# Patient Record
Sex: Male | Born: 1945 | Hispanic: No | Marital: Married | Smoking: Former smoker
Health system: Southern US, Community
[De-identification: ages and names within clinical notes are randomized; demographics above are authoritative.]

## PROBLEM LIST (undated history)

## (undated) DIAGNOSIS — E785 Hyperlipidemia, unspecified: Secondary | ICD-10-CM

## (undated) DIAGNOSIS — E119 Type 2 diabetes mellitus without complications: Secondary | ICD-10-CM

## (undated) DIAGNOSIS — I10 Essential (primary) hypertension: Secondary | ICD-10-CM

## (undated) DIAGNOSIS — J90 Pleural effusion, not elsewhere classified: Secondary | ICD-10-CM

## (undated) HISTORY — DX: Essential (primary) hypertension: I10

## (undated) HISTORY — PX: WISDOM TOOTH EXTRACTION: SHX21

## (undated) HISTORY — DX: Hyperlipidemia, unspecified: E78.5

---

## 2007-10-25 ENCOUNTER — Telehealth (INDEPENDENT_AMBULATORY_CARE_PROVIDER_SITE_OTHER): Payer: Self-pay | Admitting: *Deleted

## 2007-10-25 ENCOUNTER — Encounter (INDEPENDENT_AMBULATORY_CARE_PROVIDER_SITE_OTHER): Payer: Self-pay | Admitting: *Deleted

## 2009-02-18 ENCOUNTER — Encounter (INDEPENDENT_AMBULATORY_CARE_PROVIDER_SITE_OTHER): Payer: Self-pay | Admitting: *Deleted

## 2009-03-01 ENCOUNTER — Ambulatory Visit: Payer: Self-pay | Admitting: Internal Medicine

## 2009-03-01 ENCOUNTER — Telehealth (INDEPENDENT_AMBULATORY_CARE_PROVIDER_SITE_OTHER): Payer: Self-pay | Admitting: *Deleted

## 2009-03-01 LAB — CONVERTED CEMR LAB
AST: 33 units/L (ref 0–37)
Albumin: 4 g/dL (ref 3.5–5.2)
Alkaline Phosphatase: 75 units/L (ref 39–117)
BUN: 19 mg/dL (ref 6–23)
Bilirubin Urine: NEGATIVE
Bilirubin, Direct: 0.1 mg/dL (ref 0.0–0.3)
Cholesterol: 344 mg/dL — ABNORMAL HIGH (ref 0–200)
Creatinine, Ser: 0.8 mg/dL (ref 0.4–1.5)
GFR calc non Af Amer: 104.32 mL/min (ref >60)
Glucose, Bld: 127 mg/dL — ABNORMAL HIGH (ref 70–99)
HCT: 42.3 % (ref 39.0–52.0)
Hemoglobin: 13.6 g/dL (ref 13.0–17.0)
Ketones, urine, test strip: NEGATIVE
MCV: 88.9 fL (ref 78.0–100.0)
Nitrite: NEGATIVE
Nitrite: NEGATIVE
Platelets: 314 10*3/uL (ref 150.0–400.0)
Potassium: 4 meq/L (ref 3.5–5.1)
Protein, U semiquant: NEGATIVE
RDW: 12.6 % (ref 11.5–14.6)
Specific Gravity, Urine: <1.005
Total Bilirubin: 0.5 mg/dL (ref 0.3–1.2)
Total CHOL/HDL Ratio: 5
Urobilinogen, UA: 0.2
Urobilinogen, UA: NEGATIVE
VLDL: 194.4 mg/dL — ABNORMAL HIGH (ref 0.0–40.0)
WBC Urine, dipstick: NEGATIVE
WBC Urine, dipstick: NEGATIVE

## 2009-03-05 ENCOUNTER — Ambulatory Visit: Payer: Self-pay | Admitting: Internal Medicine

## 2009-03-05 DIAGNOSIS — M109 Gout, unspecified: Secondary | ICD-10-CM | POA: Insufficient documentation

## 2009-03-05 DIAGNOSIS — E785 Hyperlipidemia, unspecified: Secondary | ICD-10-CM

## 2009-03-05 DIAGNOSIS — I10 Essential (primary) hypertension: Secondary | ICD-10-CM | POA: Insufficient documentation

## 2009-03-11 LAB — CONVERTED CEMR LAB: Hgb A1c MFr Bld: 7.4 % — ABNORMAL HIGH (ref 4.6–6.5)

## 2009-04-12 ENCOUNTER — Telehealth (INDEPENDENT_AMBULATORY_CARE_PROVIDER_SITE_OTHER): Payer: Self-pay | Admitting: *Deleted

## 2009-06-11 ENCOUNTER — Ambulatory Visit: Payer: Self-pay | Admitting: Internal Medicine

## 2009-06-12 LAB — CONVERTED CEMR LAB
ALT: 27 units/L (ref 0–53)
AST: 21 units/L (ref 0–37)
Albumin: 4.3 g/dL (ref 3.5–5.2)
Alkaline Phosphatase: 59 units/L (ref 39–117)
Direct LDL: 158.7 mg/dL
HDL: 44.7 mg/dL (ref >39.00)
Hgb A1c MFr Bld: 7 % — ABNORMAL HIGH (ref 4.6–6.5)
Total Bilirubin: 0.5 mg/dL (ref 0.3–1.2)
VLDL: 130 mg/dL — ABNORMAL HIGH (ref 0.0–40.0)

## 2009-06-18 ENCOUNTER — Ambulatory Visit: Payer: Self-pay | Admitting: Internal Medicine

## 2009-09-13 ENCOUNTER — Ambulatory Visit: Payer: Self-pay | Admitting: Internal Medicine

## 2009-09-16 LAB — CONVERTED CEMR LAB
Creatinine, Ser: 0.8 mg/dL (ref 0.4–1.5)
Creatinine,U: 70.3 mg/dL
Microalb, Ur: 0.5 mg/dL (ref 0.0–1.9)
Total CHOL/HDL Ratio: 7
VLDL: 183 mg/dL — ABNORMAL HIGH (ref 0.0–40.0)

## 2009-09-20 ENCOUNTER — Encounter (INDEPENDENT_AMBULATORY_CARE_PROVIDER_SITE_OTHER): Payer: Self-pay | Admitting: *Deleted

## 2009-09-20 ENCOUNTER — Ambulatory Visit: Payer: Self-pay | Admitting: Internal Medicine

## 2009-09-20 DIAGNOSIS — E119 Type 2 diabetes mellitus without complications: Secondary | ICD-10-CM | POA: Insufficient documentation

## 2009-09-20 DIAGNOSIS — M2550 Pain in unspecified joint: Secondary | ICD-10-CM | POA: Insufficient documentation

## 2009-10-09 ENCOUNTER — Ambulatory Visit: Payer: Self-pay | Admitting: Internal Medicine

## 2009-12-23 ENCOUNTER — Ambulatory Visit: Payer: Self-pay | Admitting: Internal Medicine

## 2009-12-23 LAB — CONVERTED CEMR LAB
Creatinine, Ser: 0.9 mg/dL (ref 0.4–1.5)
Hgb A1c MFr Bld: 6.5 % (ref 4.6–6.5)
Total CHOL/HDL Ratio: 7
Uric Acid, Serum: 7 mg/dL (ref 4.0–7.8)
VLDL: 88.2 mg/dL — ABNORMAL HIGH (ref 0.0–40.0)

## 2009-12-26 ENCOUNTER — Ambulatory Visit: Payer: Self-pay | Admitting: Internal Medicine

## 2010-02-04 NOTE — Assessment & Plan Note (Signed)
Summary: 3 MO. F/U - JR/rescd cbs   Vital Signs:  Patient profile:   65 year old male Weight:      196.2 pounds Pulse rate:   84 / minute Resp:     16 per minute BP sitting:   124 / 80  (left arm) Cuff size:   large  Vitals Entered By: Shonna Chock (June 18, 2009 8:33 AM) CC: 3 Month follow-up on labs , Hypertension Management Comments REVIEWED MED LIST, PATIENT AGREED DOSE AND INSTRUCTION CORRECT    CC:  3 Month follow-up on labs  and Hypertension Management.  History of Present Illness: Labs  & risks reviewed & discussed.  All parameters improved but still not @ goal.Active @ work but no regular CVE. Decreased white carbs. Weight down 5# since 02/2009.FBS not checked. No hypoglycemia.  Hypertension History:      He denies headache, chest pain, palpitations, dyspnea with exertion, orthopnea, PND, peripheral edema, visual symptoms, neurologic problems, syncope, and side effects from treatment.  BP 130/80.        Positive major cardiovascular risk factors include male age 62 years old or older, diabetes, hyperlipidemia, and hypertension.  Negative major cardiovascular risk factors include non-tobacco-user status.     Allergies (verified): No Known Drug Allergies  Review of Systems General:  Denies fatigue. Eyes:  Denies blurring, double vision, and vision loss-both eyes; No retinopathy 3 mos ago. CV:  Denies leg cramps with exertion, lightheadness, and near fainting. Derm:  Denies poor wound healing. Neuro:  Denies numbness and tingling; No burning in feet. Endo:  Denies excessive hunger, excessive thirst, and excessive urination.  Physical Exam  General:  well-nourished; alert,appropriate and cooperative throughout examination Lungs:  Normal respiratory effort, chest expands symmetrically. Lungs are clear to auscultation, no crackles or wheezes. Heart:  Normal rate and regular rhythm. S1 and S2 normal without gallop, murmur, click, rub or other extra sounds. Abdomen:  Bowel  sounds positive,abdomen soft and non-tender without masses, organomegaly or hernias noted. Pulses:  R and L carotid,radial,dorsalis pedis and posterior tibial pulses are full and equal bilaterally Extremities:  No clubbing, cyanosis, edema. Neurologic:  alert & oriented X3 and sensation intact to light touch over feet.   Skin:  Intact without suspicious lesions or rashes Psych:  memory intact for recent and remote, normally interactive, and good eye contact.     Impression & Recommendations:  Problem # 1:  DIABETES MELLITUS, UNCONTROLLED (ICD-250.02) But A1c  improved His updated medication list for this problem includes:    Lisinopril 40 Mg Tabs (Lisinopril) .Marland Kitchen... 1 once daily    Metformin Hcl 500 Mg Xr24h-tab (Metformin hcl) .Marland Kitchen... 1 two times a day with 2 largest meals  Problem # 2:  HYPERTENSION (ICD-401.9) Controlled His updated medication list for this problem includes:    Lisinopril 40 Mg Tabs (Lisinopril) .Marland Kitchen... 1 once daily    Amlodipine Besylate 5 Mg Tabs (Amlodipine besylate) .Marland Kitchen... 1 once daily  Problem # 3:  HYPERLIPIDEMIA (ICD-272.4) TG significantly lower but not @ goal The following medications were removed from the medication list:    Antara 130 Mg Caps (Fenofibrate micronized) .Marland Kitchen... 1 once daily  Problem # 4:  GOUT (ICD-274.9) Quiescent The following medications were removed from the medication list:    Allopurinol 100 Mg Tabs (Allopurinol) .Marland Kitchen... Take 1 tab by mouth once daily  Complete Medication List: 1)  Indomethacin 50 Mg Caps (Indomethacin) .... Take 1 cap three times a day with food x3days,then two times a day for  3 days, then 1 once daily 2)  Cialis 10 Mg Tabs (Tadalafil) .... Take 1 tab by mouth once daily 1 hour before needed 3)  Lisinopril 40 Mg Tabs (Lisinopril) .Marland Kitchen.. 1 once daily 4)  Amlodipine Besylate 5 Mg Tabs (Amlodipine besylate) .Marland Kitchen.. 1 once daily 5)  Metformin Hcl 500 Mg Xr24h-tab (Metformin hcl) .Marland Kitchen.. 1 two times a day with 2 largest  meals  Hypertension Assessment/Plan:      The patient's hypertensive risk group is category C: Target organ damage and/or diabetes.  Today's blood pressure is 124/80.    Patient Instructions: 1)  Consume LESS THAN 40 grams of HFCS "sugar" / day. 2)  It is important that you exercise regularly at least 20 minutes 5 times a week. If you develop chest pain, have severe difficulty breathing, or feel very tired , stop exercising immediately and seek medical attention. 3)  Check your blood sugars regularly. If your readings are usually above : 150 or below 90 you should contact our office. 4)  Check your feet each night for sore areas, calluses or signs of infection. 5)  Take an  81 mg coated Aspirin every day. 6)  Please schedule a follow-up appointment in 3 months. 7)  BUN,creat, K+ prior to visit, ICD-9:401.9 8)  Lipid Panel prior to visit, ICD-9:272.2; uric acid :274.9 9)  HbgA1C prior to visit, ICD-9:250.02 10)  Urine Microalbumin prior to visit, ICD-9:250.02 Prescriptions: METFORMIN HCL 500 MG XR24H-TAB (METFORMIN HCL) 1 two times a day WITH 2 largest meals  #180 x 0   Entered and Authorized by:   Marga Melnick MD   Signed by:   Marga Melnick MD on 06/18/2009   Method used:   Faxed to ...       [facility] [address] (retail)       [phone]       [place], [place]  [address]       Ph: [phone]       Fax: [phone]   RxID:   [id number]

## 2010-02-04 NOTE — Progress Notes (Signed)
Summary: NEEDS LABS FAXED  Phone Note Call from Patient Call back at Work Phone [phone] Call back at FAX - [phone]   Caller: Patient Summary of Call: PATIENT WILL SEE DR HOPPER FOR NEW PATIENT VISIT ON 3/1, BUT HE HAD LABS TODAY ON 2/25---HE WOULD LIKE HIS LABS FAXED TO HIS OFFICE AT [phone]   Initial call taken by: Jerolyn Shin,  March 01, 2009 12:19 PM  Follow-up for Phone Call        done.Marland KitchenMarland KitchenDoristine Devoid  March 04, 2009 8:14 AM

## 2010-02-04 NOTE — Assessment & Plan Note (Signed)
Summary: NEW TO EST,CPX,BCBS,LABS PRIOR/RH......   Vital Signs:  Patient profile:   65 year old male Height:      68 inches Weight:      196 pounds BMI:     29.91 Temp:     98.6 degrees F oral Pulse rate:   92 / minute Resp:     18 per minute BP sitting:   170 / 104  (left arm)  Vitals Entered By: Jeremy Johann CMA (March 05, 2009 1:08 PM) CC: NEW TO ESTABLISH, elevated BP, gout, Hypertension Management   CC:  NEW TO ESTABLISH, elevated BP, gout, and Hypertension Management.  History of Present Illness: Derrick Singleton is establishing as a new patient; he had seen [facility] for dyslipidemia & HTN.  Hypertension History:      He denies headache, chest pain, palpitations, dyspnea with exertion, orthopnea, PND, peripheral edema, visual symptoms, neurologic problems, and syncope.  Further comments include: BP 139/100- 194/133 off meds X 3 weeks.        Positive major cardiovascular risk factors include male age 98 years old or older, hyperlipidemia, and hypertension.  Negative major cardiovascular risk factors include non-tobacco-user status.     Preventive Screening-Counseling & Management  Alcohol-Tobacco     Smoking Status: quit  Caffeine-Diet-Exercise     Does Patient Exercise: yes  Allergies (verified): No Known Drug Allergies  Past History:  Past Medical History: Gout Hyperlipidemia Hypertension Low Testosterone  Past Surgical History: Wisdom Teeth Resection; Colonoscopy negative @ age 76  Family History: Father: lung cancer(smoker) Mother: neg Siblings: neg  Social History: Occupation:Owner of [employer] Married Former Smoker: smoked 40 yrs; quit 2008 Alcohol use-yes: wine 1-2 /night Regular exercise-yes Diet : "60% [place] , 40% American"Smoking Status:  quit Does Patient Exercise:  yes  Review of Systems  The patient denies anorexia, fever, vision loss, decreased hearing, hoarseness, prolonged cough, hemoptysis, abdominal pain, melena,  hematochezia, severe indigestion/heartburn, hematuria, incontinence, suspicious skin lesions, depression, unusual weight change, abnormal bleeding, enlarged lymph nodes, and angioedema.         Weight gain 15 #.  MS:  Complains of joint pain; denies joint redness, joint swelling, low back pain, mid back pain, and thoracic pain; PMH of R ankle & L hand gout attacks; treated with Indomethacin until swelling gone then Allopurinol X 1-2 months.  Physical Exam  General:  well-nourished; alert,appropriate and cooperative throughout examination Head:  Normocephalic and atraumatic without obvious abnormalities. No  alopecia Eyes:  No corneal or conjunctival inflammation noted. EOMI. Perrla. Funduscopic exam benign, without hemorrhages, exudates or papilledema. Vision grossly normal. Ears:  External ear exam shows no significant lesions or deformities.  Otoscopic examination reveals clear canals, tympanic membranes are intact bilaterally without bulging, retraction, inflammation or discharge. Hearing is grossly normal bilaterally. mild scarring R TM Nose:  External nasal examination shows no deformity or inflammation. Nasal mucosa are pink and moist without lesions or exudates. Mouth:  Oral mucosa and oropharynx without lesions or exudates.  Dental staining Neck:  No deformities, masses, or tenderness noted. Lungs:  Normal respiratory effort, chest expands symmetrically. Lungs are clear to auscultation, no crackles or wheezes. Heart:  Normal rate and regular rhythm. S1 and S2 normal without  murmur, click, rub. S4 gallop;grade1  /6 systolic murmur.   Abdomen:  Bowel sounds positive,abdomen soft and non-tender without masses, organomegaly. Ventral  hernia noted. Rectal:  No external abnormalities noted. Normal sphincter tone. No rectal masses or tenderness. Genitalia:  Testes bilaterally descended without nodularity, tenderness or  masses. No scrotal masses or lesions. No penis lesions or urethral discharge.  L varicocele.   Prostate:  Prostate gland firm and smooth, ULN  enlargement, w/o nodularity, tenderness, mass, asymmetry or induration. Msk:  No deformity or scoliosis noted of thoracic or lumbar spine.   Pulses:  R and L carotid,radial,dorsalis pedis and posterior tibial pulses are full and equal bilaterally Extremities:  No clubbing, cyanosis, edema, or deformity noted with normal full range of motion of all joints.   Neurologic:  alert & oriented X3, gait normal, and DTRs symmetrical and normal.   Skin:  Intact without suspicious lesions or rashes Cervical Nodes:  No lymphadenopathy noted Axillary Nodes:  No palpable lymphadenopathy Inguinal Nodes:  No significant adenopathy Psych:  memory intact for recent and remote, normally interactive, and good eye contact.     Impression & Recommendations:  Problem # 1:  PREVENTIVE HEALTH CARE (ICD-V70.0)  Orders: EKG w/ Interpretation (93000)  Problem # 2:  HYPERTENSION (ICD-401.9)  uncontrolled off meds   Orders: EKG w/ Interpretation (93000)  His updated medication list for this problem includes:    Lisinopril 40 Mg Tabs (Lisinopril) .Marland Kitchen... 1 once daily    Amlodipine Besylate 5 Mg Tabs (Amlodipine besylate) .Marland Kitchen... 1 once daily  Problem # 3:  HYPERLIPIDEMIA (ICD-272.4)  His updated medication list for this problem includes:    Antara 130 Mg Caps (Fenofibrate micronized) .Marland Kitchen... 1 once daily  Problem # 4:  GOUT (ICD-274.9)  His updated medication list for this problem includes:    Allopurinol 100 Mg Tabs (Allopurinol) .Marland Kitchen... Take 1 tab by mouth once daily  Complete Medication List: 1)  Indomethacin 50 Mg Caps (Indomethacin) .... Take 1 cap three times a day with food x3days,then two times a day for 3 days, then 1 once daily 2)  Allopurinol 100 Mg Tabs (Allopurinol) .... Take 1 tab by mouth once daily 3)  Cialis 10 Mg Tabs (Tadalafil) .... Take 1 tab by mouth once daily 1 hour before needed 4)  Lisinopril 40 Mg Tabs (Lisinopril) .Marland Kitchen..  1 once daily 5)  Amlodipine Besylate 5 Mg Tabs (Amlodipine besylate) .Marland Kitchen.. 1 once daily 6)  Antara 130 Mg Caps (Fenofibrate micronized) .Marland Kitchen.. 1 once daily  Hypertension Assessment/Plan:      The patient's hypertensive risk group is category B: At least one risk factor (excluding diabetes) with no target organ damage.  Today's blood pressure is 170/104.    Patient Instructions: 1)  Consume complex brown carbs in preference to white  carbs & LESS THAN 40 grams of sugar / day from foods & drinks with High Fructose Corn Syrup as #1 ,2 or #3 on label. 2)  Please schedule a follow-up appointment in 3 months. 3)  Hepatic Panel ;uric acid; 4)  Lipid Panel; 5)  HbgA1C . 6)  Check your Blood Pressure regularly. Your  minimum goal = < 140/90 Prescriptions: CIALIS 10 MG TABS (TADALAFIL) TAKE 1 TAB by mouth once daily 1 HOUR BEFORE NEEDED  #6 x 5   Entered and Authorized by:   Marga Melnick MD   Signed by:   Marga Melnick MD on 03/05/2009   Method used:   Print then Give to Patient   RxID:   [id number] INDOMETHACIN 50 MG CAPS (INDOMETHACIN) TAKE 1 CAP three times a day WITH FOOD X3DAYS,THEN two times a day FOR 3 DAYS, THEN 1 once daily  #30 x 1   Entered and Authorized by:   Marga Melnick MD   Signed by:  Marga Melnick MD on 03/05/2009   Method used:   Print then Give to Patient   RxID:   [id number] ANTARA 130 MG CAPS (FENOFIBRATE MICRONIZED) 1 once daily  #30 x 5   Entered and Authorized by:   Marga Melnick MD   Signed by:   Marga Melnick MD on 03/05/2009   Method used:   Print then Give to Patient   RxID:   [id number] AMLODIPINE BESYLATE 5 MG TABS (AMLODIPINE BESYLATE) 1 once daily  #30 x 5   Entered and Authorized by:   Marga Melnick MD   Signed by:   Marga Melnick MD on 03/05/2009   Method used:   Print then Give to Patient   RxID:   [id number] LISINOPRIL 40 MG TABS (LISINOPRIL) 1 once daily  #30 x 5   Entered and Authorized by:   Marga Melnick MD   Signed  by:   Marga Melnick MD on 03/05/2009   Method used:   Print then Give to Patient   RxID:   [id number]

## 2010-02-04 NOTE — Letter (Signed)
Summary: New Patient Letter  [facility]  [address] [place], [place] [address]   Phone: [phone]  Fax: [phone]       02/18/2009 MRN: [id number]  [facility] [address] [place], [place]  [address]  Dear Derrick Singleton,   Welcome to [facility] and thank you for choosing [place] as your Primary Care Providers. Enclosed you will find information about our practice that we hope you find helpful. We have also enclosed forms to be filled out prior to your visit. This will provide [place] with the necessary information and facilitate your being seen in a timely manner. If you have any questions, please call us at: [phone] and we will be happy to assist you. We look forward to seeing you at your scheduled appointment time.  Appointment Monday, March 18, 2009 at 10:45am  with Dr. Marga Melnick  Sincerely,  Primary Health Care Team  Please arrive 15 minutes early for your first appointment and bring your insurance card. Co-pay is required at the time of your visit.  *****Please call the office if you are not able to keep this appointment. There is a charge of $50.00 if any appointment is not cancelled or rescheduled within 24 hours*****

## 2010-02-04 NOTE — Assessment & Plan Note (Signed)
Summary: RTO 3 MONTHS/CBS   Vital Signs:  Patient profile:   65 year old male Weight:      187 pounds Temp:     98.1 degrees F oral Pulse rate:   84 / minute Resp:     15 per minute BP sitting:   118 / 80  (left arm) Cuff size:   large  Vitals Entered By: Shonna Chock CMA (September 20, 2009 9:38 AM) CC: Follow-up visit: Discuss labs (patient with mailed copy), Type 2 diabetes mellitus follow-up   CC:  Follow-up visit: Discuss labs (patient with mailed copy) and Type 2 diabetes mellitus follow-up.  History of Present Illness: Hypertension Follow-Up      This is a 65 year old man who presents for Hypertension follow-up.  The patient denies lightheadedness, urinary frequency, headaches, and fatigue.  The patient denies the following associated symptoms: chest pain, chest pressure, exercise intolerance, dyspnea, palpitations, syncope, leg edema, and pedal edema.  Compliance with medications (by patient report) has been near 100%.  The patient reports that dietary compliance has been excellent.  The patient reports exercising occasionally.  Adjunctive measures currently used by the patient include modified  salt restriction.  BP records reviewed : average 120/82. Type 2 Diabetes Mellitus Follow-Up      The patient is also here for Type 2 diabetes mellitus follow-up.  The patient reports weight loss, but denies polyuria, polydipsia, blurred vision, self managed hypoglycemia, weight gain, and numbness of extremities.  The patient denies the following symptoms: neuropathic pain, vomiting, orthostatic symptoms, poor wound healing, intermittent claudication, vision loss, and foot ulcer.  Since the last visit the patient reports compliance with medications.  The patient has been measuring capillary blood glucose before breakfast; average 127, high 135.  Since the last visit, the patient reports having had no eye care and no foot care. Hyperlipidemia Follow-Up      The patient also presents for  Hyperlipidemia follow-up.  The patient denies muscle aches, GI upset, abdominal pain, flushing, itching, constipation, and diarrhea.  Lipids reviewed : TG 915. He is avoiding HFCS.  Current Medications (verified): 1)  Indomethacin 50 Mg Caps (Indomethacin) .... Take 1 Cap Three Times A Day With Food X3days,then Two Times A Day For 3 Days, Then 1 Once Daily 2)  Cialis 10 Mg Tabs (Tadalafil) .... Take 1 Tab By Mouth Once Daily 1 Hour Before Needed 3)  Lisinopril 40 Mg Tabs (Lisinopril) .Marland Kitchen.. 1 Once Daily 4)  Amlodipine Besylate 5 Mg Tabs (Amlodipine Besylate) .Marland Kitchen.. 1 Once Daily 5)  Metformin Hcl 500 Mg Xr24h-Tab (Metformin Hcl) .Marland Kitchen.. 1 Two Times A Day With 2 Largest Meals  Allergies (verified): No Known Drug Allergies  Review of Systems MS:  Complains of joint pain and joint swelling; denies joint redness; L wrist swollen intermittently , better with Indomethacin.  Physical Exam  General:  well-nourished; alert,appropriate and cooperative throughout examination Lungs:  Normal respiratory effort, chest expands symmetrically. Lungs are clear to auscultation, no crackles or wheezes. Heart:  normal rate, regular rhythm, no gallop, no rub, no JVD, no HJR, and grade 1 /6 systolic murmur.   Pulses:  R and L carotid,radial,dorsalis pedis and posterior tibial pulses are full and equal bilaterally Extremities:  No clubbing, cyanosis, edema. Fusiform changes L wrist Skin:  Intact without suspicious lesions or rashes Cervical Nodes:  No lymphadenopathy noted Axillary Nodes:  No palpable lymphadenopathy Psych:  memory intact for recent and remote, normally interactive, and good eye contact.   Focused & intelligent  Impression & Recommendations:  Problem # 1:  DIABETES MELLITUS, CONTROLLED (ICD-250.00)  His updated medication list for this problem includes:    Lisinopril 40 Mg Tabs (Lisinopril) .Marland Kitchen... 1 once daily    Metformin Hcl 500 Mg Xr24h-tab (Metformin hcl) .Marland Kitchen... 1 two times a day with 2 largest  meals  Orders: Specimen Handling ([address])  Problem # 2:  ARTHRALGIA (ICD-719.40)  R/O gout  Orders: Venipuncture (60454) TLB-Uric Acid, Blood (84550-URIC) T-Wrist Comp Left Min 3 Views (73110TC) Specimen Handling (09811)  Problem # 3:  HYPERLIPIDEMIA (ICD-272.4)  His updated medication list for this problem includes:    Trilipix 135 Mg Cpdr (Choline fenofibrate) .Marland Kitchen... 1 once daily  Problem # 4:  HYPERTENSION (ICD-401.9) controlled His updated medication list for this problem includes:    Lisinopril 40 Mg Tabs (Lisinopril) .Marland Kitchen... 1 once daily    Amlodipine Besylate 5 Mg Tabs (Amlodipine besylate) .Marland Kitchen... 1 once daily  Problem # 5:  SCREENING, COLON CANCER (ICD-V76.51)  Orders: Gastroenterology Referral (GI)  Complete Medication List: 1)  Indomethacin 50 Mg Caps (Indomethacin) .... Take 1 cap three times a day with food x3days,then two times a day for 3 days, then 1 once daily 2)  Cialis 10 Mg Tabs (Tadalafil) .... Take 1 tab by mouth once daily 1 hour before needed 3)  Lisinopril 40 Mg Tabs (Lisinopril) .Marland Kitchen.. 1 once daily 4)  Amlodipine Besylate 5 Mg Tabs (Amlodipine besylate) .Marland Kitchen.. 1 once daily 5)  Metformin Hcl 500 Mg Xr24h-tab (Metformin hcl) .Marland Kitchen.. 1 two times a day with 2 largest meals 6)  Trilipix 135 Mg Cpdr (Choline fenofibrate) .Marland Kitchen.. 1 once daily  Other Orders: Admin 1st Vaccine (91478) Flu Vaccine 63yrs + (29562) Flu Vaccine Consent Questions     Do you have a history of severe allergic reactions to this vaccine? no    Any prior history of allergic reactions to egg and/or gelatin? no    Do you have a sensitivity to the preservative Thimersol? no    Do you have a past history of Guillan-Barre Syndrome? no    Do you currently have an acute febrile illness? no    Have you ever had a severe reaction to latex? no    Vaccine information given and explained to patient? yes    Are you currently pregnant? no    Lot Number:[id number]   Exp Date:07/05/2010   Site Given  Right  Deltoid IMOrders: Admin 1st Vaccine (13086) Flu Vaccine 63yrs + (57846)  Patient Instructions: 1)  Please schedule a follow-up appointment in 3 months. 2)  Lipid Panel; Uric Acid;HbgA1C & BUN,creat, K+ prior to visit. Prescriptions: METFORMIN HCL 500 MG XR24H-TAB (METFORMIN HCL) 1 two times a day WITH 2 largest meals  #180 x 1   Entered and Authorized by:   Marga Melnick MD   Signed by:   Marga Melnick MD on 09/20/2009   Method used:   Print then Give to Patient   RxID:   [id number] AMLODIPINE BESYLATE 5 MG TABS (AMLODIPINE BESYLATE) 1 once daily  #90 x 1   Entered and Authorized by:   Marga Melnick MD   Signed by:   Marga Melnick MD on 09/20/2009   Method used:   Print then Give to Patient   RxID:   [id number] LISINOPRIL 40 MG TABS (LISINOPRIL) 1 once daily  #90 x 1   Entered and Authorized by:   Marga Melnick MD   Signed by:   Marga Melnick MD on 09/20/2009   Method used:  Print then Give to Patient   RxID:   [id number] CIALIS 10 MG TABS (TADALAFIL) TAKE 1 TAB by mouth once daily 1 HOUR BEFORE NEEDED  #6 x 5   Entered and Authorized by:   Marga Melnick MD   Signed by:   Marga Melnick MD on 09/20/2009   Method used:   Print then Give to Patient   RxID:   [id number] INDOMETHACIN 50 MG CAPS (INDOMETHACIN) TAKE 1 CAP three times a day WITH FOOD X3DAYS,THEN two times a day FOR 3 DAYS, THEN 1 once daily  #30 Each x 1   Entered and Authorized by:   Marga Melnick MD   Signed by:   Marga Melnick MD on 09/20/2009   Method used:   Print then Give to Patient   RxID:   [id number] TRILIPIX 135 MG CPDR (CHOLINE FENOFIBRATE) 1 once daily  #30 x 2   Entered and Authorized by:   Marga Melnick MD   Signed by:   Marga Melnick MD on 09/20/2009   Method used:   Print then Give to Patient   RxID:   [id number]  .lbflu

## 2010-02-04 NOTE — Progress Notes (Signed)
Summary: Wants rx changed to generic  Phone Note Call from Patient Call back at Home Phone [phone]   Caller: Patient Call For: Marga Melnick MD Summary of Call: Needs a return call Initial call taken by: Barnie Mort,  April 12, 2009 10:59 AM  Follow-up for Phone Call        wanted to know if some meds could be changed to generic; Antara 130mg , pt says med is costing #130 for 30 pills. if possible willneed 90 days sent to [facility] on [address]. Kandice Hams  April 12, 2009 11:42 AM  Follow-up by: Kandice Hams,  April 12, 2009 11:42 AM  Additional Follow-up for Phone Call Additional follow up Details #1::        see samples ; with card is supposed to cost no more than $15 /month Additional Follow-up by: Marga Melnick MD,  April 12, 2009 12:58 PM    Additional Follow-up for Phone Call Additional follow up Details #2::    spoke with pt informed of samples upfront with  savings card .Kandice Hams  April 12, 2009 1:04 PM  Follow-up by: Kandice Hams,  April 12, 2009 1:05 PM

## 2010-02-04 NOTE — Letter (Signed)
Summary: Pre Visit Letter Revised  [facility]  [address] [place], [place] [address]   Phone: [phone]  Fax: [phone]        09/20/2009 MRN: [id number]   [facility] [address] [place], [place]  [address]             Welcome to the Gastroenterology Division at [facility].    You are scheduled to see a nurse for your pre-procedure visit on October 15, 2009 at 2:00pm on the 3rd floor at [facility], [address]  We ask that you try to arrive at our office 15 minutes prior to your appointment time to allow for check-in.  Please take a minute to review the attached form.  If you answer "Yes" to one or more of the questions on the first page, we ask that you call the person listed at your earliest opportunity.  If you answer "No" to all of the questions, please complete the rest of the form and bring it to your appointment.    Your nurse visit will consist of discussing your medical and surgical history, your immediate family medical history, and your medications.   If you are unable to list all of your medications on the form, please bring the medication bottles to your appointment and we will list them.  We will need to be aware of both prescribed and over the counter drugs.  We will need to know exact dosage information as well.    Please be prepared to read and sign documents such as consent forms, a financial agreement, and acknowledgement forms.  If necessary, and with your consent, a friend or relative is welcome to sit-in on the nurse visit with you.  Please bring your insurance card so that we may make a copy of it.  If your insurance requires a referral to see a specialist, please bring your referral form from your primary care physician.  No co-pay is required for this nurse visit.     If you cannot keep your appointment, please call [phone] to cancel or reschedule prior to your appointment date.  This allows [place] the  opportunity to schedule an appointment for another patient in need of care.    Thank you for choosing [facility] for your medical needs.  We appreciate the opportunity to care for you.  Please visit [place] at our website  to learn more about our practice.  Sincerely, The Gastroenterology Division

## 2010-02-06 NOTE — Assessment & Plan Note (Signed)
Summary: rto 3 months/cbs   Vital Signs:  Patient profile:   65 year old male Weight:      191.4 pounds BMI:     29.21 Pulse rate:   72 / minute Resp:     14 per minute BP sitting:   118 / 70  (left arm) Cuff size:   large  Vitals Entered By: Shonna Chock CMA (December 26, 2009 10:29 AM) CC: 3 month follow-up ( copy of labs given) , Type 2 diabetes mellitus follow-up   CC:  3 month follow-up ( copy of labs given)  and Type 2 diabetes mellitus follow-up.  History of Present Illness: Hypertension Follow-Up      This is a 65 year old man who presents for Hypertension follow-up.  The patient denies lightheadedness, urinary frequency, headaches, edema, and fatigue.  The patient denies the following associated symptoms: chest pain, chest pressure, dyspnea, palpitations, and syncope.  Compliance with medications (by patient report) has been near 100%.  The patient reports no exercise.  Adjunctive measures currently used by the patient include salt restriction.  BP 13/90 @ home. Hyperlipidemia Follow-Up      The patient also presents for Hyperlipidemia follow-up.  The patient denies muscle aches, GI upset, abdominal pain, flushing, constipation, and diarrhea.  Compliance with medications (by patient report) has been near 100%.  Adjunctive measures currently used by the patient include fiber and fish oil supplements.  TG decreased from 915 to 441. LDL 160.9. Risks discussed. Type 2 Diabetes Mellitus Follow-Up      The patient is also here for Type 2 diabetes mellitus follow-up.  The patient reports polydipsia, but denies blurred vision, self managed hypoglycemia, and numbness of extremities.  The patient denies the following symptoms: neuropathic pain, orthostatic symptoms, poor wound healing, vision loss, and foot ulcer.  The patient has been measuring capillary blood glucose before breakfast, FBS 107-140.  Since the last visit, the patient reports having had no eye care and no foot care. A1c 6.5 %  ( average sugar 140, risk 30%).    Current Medications (verified): 1)  Cialis 10 Mg Tabs (Tadalafil) .... Take 1 Tab By Mouth Once Daily 1 Hour Before Needed 2)  Lisinopril 40 Mg Tabs (Lisinopril) .Marland Kitchen.. 1 Once Daily 3)  Amlodipine Besylate 5 Mg Tabs (Amlodipine Besylate) .Marland Kitchen.. 1 Once Daily 4)  Metformin Hcl 500 Mg Xr24h-Tab (Metformin Hcl) .Marland Kitchen.. 1 Two Times A Day With 2 Largest Meals 5)  Trilipix 135 Mg Cpdr (Choline Fenofibrate) .Marland Kitchen.. 1 Once Daily  Allergies (verified): No Known Drug Allergies  Physical Exam  General:  well-nourished;alert,appropriate and cooperative throughout examination Lungs:  Normal respiratory effort, chest expands symmetrically. Lungs are clear to auscultation, no crackles or wheezes. Heart:  Normal rate and regular rhythm. S1 and S2 normal without gallop, murmur, click, rub .S 4 Pulses:  R and L carotid,radial,dorsalis pedis and posterior tibial pulses are full and equal bilaterally Extremities:  No clubbing, cyanosis, edema, or deformity noted .Good nail health. Neurologic:  alert & oriented X3 and sensation intact to light touch over feet.   Skin:  Intact without suspicious lesions or rashes; minor granulomatous change RUE  Psych:  memory intact for recent and remote, normally interactive, and good eye contact.     Impression & Recommendations:  Problem # 1:  HYPERLIPIDEMIA (ICD-272.4) dramatic improvement but TG not @ goal His updated medication list for this problem includes:    Trilipix 135 Mg Cpdr (Choline fenofibrate) .Marland Kitchen... 1 once daily  Livalo 2 Mg Tabs (Pitavastatin calcium) .Marland Kitchen... 1 every weds  Problem # 2:  HYPERTENSION (ICD-401.9) controlled His updated medication list for this problem includes:    Lisinopril 40 Mg Tabs (Lisinopril) .Marland Kitchen... 1 once daily    Amlodipine Besylate 5 Mg Tabs (Amlodipine besylate) .Marland Kitchen... 1 once daily  Problem # 3:  DIABETES MELLITUS, CONTROLLED (ICD-250.00) A1c 6.5 5 His updated medication list for this problem  includes:    Lisinopril 40 Mg Tabs (Lisinopril) .Marland Kitchen... 1 once daily    Metformin Hcl 500 Mg Xr24h-tab (Metformin hcl) .Marland Kitchen... 1 two times a day with 2 largest meals  Complete Medication List: 1)  Cialis 10 Mg Tabs (Tadalafil) .... Take 1 tab by mouth once daily 1 hour before needed 2)  Lisinopril 40 Mg Tabs (Lisinopril) .Marland Kitchen.. 1 once daily 3)  Amlodipine Besylate 5 Mg Tabs (Amlodipine besylate) .Marland Kitchen.. 1 once daily 4)  Metformin Hcl 500 Mg Xr24h-tab (Metformin hcl) .Marland Kitchen.. 1 two times a day with 2 largest meals 5)  Trilipix 135 Mg Cpdr (Choline fenofibrate) .Marland Kitchen.. 1 once daily 6)  Livalo 2 Mg Tabs (Pitavastatin calcium) .Marland Kitchen.. 1 every weds  Patient Instructions: 1)  Please schedule a follow-up appointment in 11 weeks: uric acid : 274.9; 2)  Hepatic Panel prior to visit, ICD-9:995.20 3)  Lipid Panel prior to visit, ICD-9:277.7. Avoid HFCS  sugar if possible as dicussed.Marland Kitchen 4)  It is important that you exercise regularly at least 20 minutes 5 times a week. If you develop chest pain, have severe difficulty breathing, or feel very tired , stop exercising immediately and seek medical attention. 5)  Check your blood sugars regularly. If your readings are usually above : 150 or below 90 you should contact our office. 6)  See your eye doctor yearly to check for diabetic eye damage. 7)  Check your feet each night for sore areas, calluses or signs of infection. Prescriptions: TRILIPIX 135 MG CPDR (CHOLINE FENOFIBRATE) 1 once daily  #30 x 5   Entered and Authorized by:   Marga Melnick MD   Signed by:   Marga Melnick MD on 12/26/2009   Method used:   Print then Give to Patient   RxID:   [id number] TRILIPIX 135 MG CPDR (CHOLINE FENOFIBRATE) 1 once daily  #30 x 5   Entered and Authorized by:   Marga Melnick MD   Signed by:   Marga Melnick MD on 12/26/2009   Method used:   Samples Given   RxID:   [id number] LIVALO 2 MG TABS (PITAVASTATIN CALCIUM) 1 pill  every Weds  #14 x 0   Entered and Authorized  by:   Marga Melnick MD   Signed by:   Marga Melnick MD on 12/26/2009   Method used:   Samples Given   RxID:   [id number]    Orders Added: 1)  Est. Patient Level IV [01027]

## 2010-03-06 ENCOUNTER — Other Ambulatory Visit: Payer: Self-pay | Admitting: Internal Medicine

## 2010-03-06 ENCOUNTER — Other Ambulatory Visit (INDEPENDENT_AMBULATORY_CARE_PROVIDER_SITE_OTHER): Payer: BC Managed Care – PPO

## 2010-03-06 ENCOUNTER — Encounter (INDEPENDENT_AMBULATORY_CARE_PROVIDER_SITE_OTHER): Payer: Self-pay | Admitting: *Deleted

## 2010-03-06 DIAGNOSIS — M109 Gout, unspecified: Secondary | ICD-10-CM

## 2010-03-06 DIAGNOSIS — E8881 Metabolic syndrome: Secondary | ICD-10-CM

## 2010-03-06 DIAGNOSIS — E785 Hyperlipidemia, unspecified: Secondary | ICD-10-CM

## 2010-03-06 DIAGNOSIS — T887XXA Unspecified adverse effect of drug or medicament, initial encounter: Secondary | ICD-10-CM

## 2010-03-06 LAB — LDL CHOLESTEROL, DIRECT: Direct LDL: 156.5 mg/dL

## 2010-03-06 LAB — LIPID PANEL
Cholesterol: 247 mg/dL — ABNORMAL HIGH (ref 0–200)
Total CHOL/HDL Ratio: 6

## 2010-03-06 LAB — HEPATIC FUNCTION PANEL
ALT: 38 U/L (ref 0–53)
AST: 30 U/L (ref 0–37)
Alkaline Phosphatase: 43 U/L (ref 39–117)
Total Bilirubin: 0.1 mg/dL — ABNORMAL LOW (ref 0.3–1.2)

## 2010-03-14 ENCOUNTER — Other Ambulatory Visit: Payer: Self-pay | Admitting: Internal Medicine

## 2010-03-14 ENCOUNTER — Encounter: Payer: Self-pay | Admitting: Internal Medicine

## 2010-03-14 ENCOUNTER — Ambulatory Visit (INDEPENDENT_AMBULATORY_CARE_PROVIDER_SITE_OTHER): Payer: BC Managed Care – PPO | Admitting: Internal Medicine

## 2010-03-14 DIAGNOSIS — E785 Hyperlipidemia, unspecified: Secondary | ICD-10-CM

## 2010-03-14 DIAGNOSIS — E119 Type 2 diabetes mellitus without complications: Secondary | ICD-10-CM

## 2010-03-14 LAB — HEMOGLOBIN A1C: Hgb A1c MFr Bld: 6.8 % — ABNORMAL HIGH (ref 4.6–6.5)

## 2010-03-14 LAB — MICROALBUMIN / CREATININE URINE RATIO: Microalb, Ur: 1.2 mg/dL (ref 0.0–1.9)

## 2010-03-18 NOTE — Assessment & Plan Note (Signed)
Summary: 11 week followup/sph   Vital Signs:  Patient profile:   65 year old male Weight:      197.2 pounds BMI:     30.09 Pulse rate:   76 / minute Resp:     12 per minute BP sitting:   120 / 78  (left arm) Cuff size:   large  Vitals Entered By: Shonna Chock CMA (March 14, 2010 9:18 AM) CC: 11 week follow-up (copy of labs given) , Type 2 diabetes mellitus follow-up   CC:  11 week follow-up (copy of labs given)  and Type 2 diabetes mellitus follow-up.  History of Present Illness:    Labs reviewed & risks discussed; TG have dropped from 650 to 419; he denies consuming no HFCS sugar .LDL is not @ goal & is unchanged. A1c had decreased from 7% to 6.6%.He  denies polyuria, polydipsia, and blurred vision.  The patient denies the following symptoms: vomiting, poor wound healing, intermittent claudication, vision loss, and foot ulcer.  Since the last visit the patient reports good dietary compliance, not exercising regularly, and monitoring blood glucose.  The patient has been measuring capillary blood glucose before breakfast, average < 130.  Since the last visit, the patient reports having had no eye care.    Current Medications (verified): 1)  Cialis 10 Mg Tabs (Tadalafil) .... Take 1 Tab By Mouth Once Daily 1 Hour Before Needed 2)  Lisinopril 40 Mg Tabs (Lisinopril) .Marland Kitchen.. 1 Once Daily 3)  Metformin Hcl 500 Mg Xr24h-Tab (Metformin Hcl) .Marland Kitchen.. 1 Two Times A Day With 2 Largest Meals 4)  Trilipix 135 Mg Cpdr (Choline Fenofibrate) .Marland Kitchen.. 1 Once Daily 5)  Livalo 2 Mg Tabs (Pitavastatin Calcium) .Marland Kitchen.. 1 Every Weds  Allergies (verified): No Known Drug Allergies  Review of Systems GI:  Denies abdominal pain, bloody stools, dark tarry stools, and indigestion; No colonoscopy : "I put it off because of busy season" [facility] discussed).  Physical Exam  General:  well-nourished;alert,appropriate and cooperative throughout examination Lungs:  Normal respiratory effort, chest expands symmetrically. Lungs  are clear to auscultation, no crackles or wheezes. Heart:  Normal rate and regular rhythm. S1 and S2 normal without gallop, murmur, click, rub .S4  Pulses:  R and L carotid,radial,dorsalis pedis and posterior tibial pulses are full and equal bilaterally Extremities:  No clubbing, cyanosis, edema. Good nail health Neurologic:  alert & oriented X3 and sensation intact to light touch over feet.   Skin:  Intact without suspicious lesions or rashes Psych:  memory intact for recent and remote, normally interactive, and good eye contact.     Impression & Recommendations:  Problem # 1:  DIABETES MELLITUS, CONTROLLED (ICD-250.00)  His updated medication list for this problem includes:    Lisinopril 40 Mg Tabs (Lisinopril) .Marland Kitchen... 1 once daily    Metformin Hcl 500 Mg Xr24h-tab (Metformin hcl) .Marland Kitchen... 1 two times a day with 2 largest meals  Orders: Venipuncture (25956) TLB-A1C / Hgb A1C (Glycohemoglobin) (83036-A1C) TLB-Microalbumin/Creat Ratio, Urine (82043-MALB)  Problem # 2:  HYPERLIPIDEMIA (ICD-272.4)  His updated medication list for this problem includes:    Trilipix 135 Mg Cpdr (Choline fenofibrate) .Marland Kitchen... 1 once daily    Livalo 2 Mg Tabs (Pitavastatin calcium) .Marland Kitchen... 1 every m,w & f  Complete Medication List: 1)  Cialis 10 Mg Tabs (Tadalafil) .... Take 1 tab by mouth once daily 1 hour before needed 2)  Lisinopril 40 Mg Tabs (Lisinopril) .Marland Kitchen.. 1 once daily 3)  Metformin Hcl 500 Mg Xr24h-tab (Metformin hcl) .Marland KitchenMarland KitchenMarland Kitchen  1 two times a day with 2 largest meals 4)  Trilipix 135 Mg Cpdr (Choline fenofibrate) .Marland Kitchen.. 1 once daily 5)  Livalo 2 Mg Tabs (Pitavastatin calcium) .Marland Kitchen.. 1 every m,w & f  Patient Instructions: 1)  Check your blood sugars regularly. If your readings are usually above :150 or below90 you should contact our office. 2)  It is important that your Diabetic A1c level is checked every 4 months. 3)  See your eye doctor yearly to check for diabetic eye damage. 4)  Check your feet each night  for sore areas, calluses or signs of infection. 5)  Please schedule a follow-up appointment in 4 months. 6)  Hepatic Panel prior to visit, ICD-9:995.20 7)  Lipid Panel prior to visit, ICD-9:272.4 8)  HbgA1C prior to visit, ICD-9:250.00 9)  Schedule a colonoscopy  to help detect colon cancer as discussed. Prescriptions: LIVALO 2 MG TABS (PITAVASTATIN CALCIUM) 1 every M,W & F  #30 x 2   Entered and Authorized by:   Marga Melnick MD   Signed by:   Marga Melnick MD on 03/14/2010   Method used:   Print then Give to Patient   RxID:   [id number]    Orders Added: 1)  Est. Patient Level III [64403] 2)  Venipuncture [47425] 3)  TLB-A1C / Hgb A1C (Glycohemoglobin) [83036-A1C] 4)  TLB-Microalbumin/Creat Ratio, Urine [82043-MALB]  Appended Document: 11 week followup/sph

## 2010-04-08 ENCOUNTER — Other Ambulatory Visit: Payer: Self-pay | Admitting: Internal Medicine

## 2010-04-09 ENCOUNTER — Other Ambulatory Visit: Payer: Self-pay | Admitting: *Deleted

## 2010-04-09 MED ORDER — METFORMIN HCL ER 500 MG PO TB24: 500.0000 mg | ORAL_TABLET | Freq: Every day | ORAL | Status: DC

## 2010-04-09 MED ORDER — LISINOPRIL 40 MG PO TABS: 40.0000 mg | ORAL_TABLET | Freq: Every day | ORAL | Status: DC

## 2010-07-07 ENCOUNTER — Other Ambulatory Visit: Payer: BC Managed Care – PPO

## 2010-07-14 ENCOUNTER — Other Ambulatory Visit: Payer: Self-pay | Admitting: Internal Medicine

## 2010-07-14 DIAGNOSIS — E785 Hyperlipidemia, unspecified: Secondary | ICD-10-CM

## 2010-07-14 DIAGNOSIS — T887XXA Unspecified adverse effect of drug or medicament, initial encounter: Secondary | ICD-10-CM

## 2010-07-15 ENCOUNTER — Other Ambulatory Visit (INDEPENDENT_AMBULATORY_CARE_PROVIDER_SITE_OTHER): Payer: BC Managed Care – PPO

## 2010-07-15 ENCOUNTER — Ambulatory Visit: Payer: BC Managed Care – PPO | Admitting: Internal Medicine

## 2010-07-15 DIAGNOSIS — T887XXA Unspecified adverse effect of drug or medicament, initial encounter: Secondary | ICD-10-CM

## 2010-07-15 DIAGNOSIS — E785 Hyperlipidemia, unspecified: Secondary | ICD-10-CM

## 2010-07-15 LAB — LIPID PANEL
Cholesterol: 221 mg/dL — ABNORMAL HIGH (ref 0–200)
HDL: 40.1 mg/dL (ref >39.00)
VLDL: 110.2 mg/dL — ABNORMAL HIGH (ref 0.0–40.0)

## 2010-07-15 LAB — HEPATIC FUNCTION PANEL
AST: 24 U/L (ref 0–37)
Albumin: 4.4 g/dL (ref 3.5–5.2)

## 2010-07-15 NOTE — Progress Notes (Signed)
Labs only

## 2010-07-19 ENCOUNTER — Encounter: Payer: Self-pay | Admitting: Internal Medicine

## 2010-07-23 ENCOUNTER — Ambulatory Visit: Payer: BC Managed Care – PPO | Admitting: Internal Medicine

## 2010-07-24 ENCOUNTER — Ambulatory Visit (INDEPENDENT_AMBULATORY_CARE_PROVIDER_SITE_OTHER): Payer: BC Managed Care – PPO | Admitting: Internal Medicine

## 2010-07-24 ENCOUNTER — Encounter: Payer: Self-pay | Admitting: Internal Medicine

## 2010-07-24 DIAGNOSIS — E785 Hyperlipidemia, unspecified: Secondary | ICD-10-CM

## 2010-07-24 MED ORDER — COLESEVELAM HCL 625 MG PO TABS: 1875.0000 mg | ORAL_TABLET | Freq: Two times a day (BID) | ORAL | Status: DC

## 2010-07-24 MED ORDER — GLUCOSE BLOOD VI STRP: ORAL_STRIP | Status: DC

## 2010-07-24 MED ORDER — ONETOUCH DELICA LANCETS MISC: Status: DC

## 2010-07-24 NOTE — Progress Notes (Signed)
Subjective:    Patient ID: Derrick Singleton, male    DOB: Nov 16, 1945, 65 y.o.   MRN: [id number]  HPI  #1Dyslipidemia assessment: Prior Advanced Lipid Testing: no due to elevated Triglycerides(TG).   Family history of premature CAD/ MI: no .  Nutrition: no HFCS sugar .  Exercise: to start Yoga . Diabetes : A1c 7.3% . HTN: controlled, (average 120/80). Smoking history  : quit 2008 .   Weight : up  10# . ROS: fatigue: no ; chest pain : no ;claudication: no; palpitations: no; abd pain/bowel changes: no ; myalgias:no;  syncope : no ; memory loss: no;skin changes: no. Lab results reviewed :TG up from 419 to 551 (risk discussed)  #2 Diabetes status assessment: Fasting or morning glucose range:  160-180 or average :  150-160  . Highest glucose 2 hours after any meal:  Not checked. Hypoglycemia : no  .                                                     Excess thirst :no;  Excess hunger:  no ;  Excess urination:  no.                                  Lightheadedness with standing:  no.                                                                                                                                 Non healing skin  ulcers or sores,especially over the feet:  no. Numbness or tingling or burning in feet : occasionally L hand .                                                                                                                                              Vision changes : no  .  Medication compliance : yes. Medication adverse  Effects:  Metformin ? Caused dizziness . Eye exam : 2 years ago. Foot care : no.  A1c/ urine microalbumin monitor:  Up from 7.1  To 7.3%   .     Review of Systems     Objective:   Physical Exam Gen.: Healthy and well-nourished in appearance. Alert, appropriate and cooperative throughout exam. Eyes: No corneal or conjunctival inflammation noted.  Vision grossly normal with  lenses. Ears: External  ear exam reveals no significant lesions or deformities. Canals clear .TMs normal. Hearing is grossly normal bilaterally. Neck: No deformities, masses, or tenderness noted.  Thyroid  normal. Lungs: Normal respiratory effort; chest expands symmetrically. Lungs are clear to auscultation without rales, wheezes, or increased work of breathing. Heart: Normal rate and rhythm. Normal S1 and S2. No gallop, click, or rub. S4 with slurring; no murmur. .                                                                                   Musculoskeletal/extremities: No clubbing, cyanosis, edema, or deformity noted..Joints normal. Nail health  good. Vascular: Carotid, radial artery, dorsalis pedis and  posterior tibial pulses are full and equal. No bruits present. Neurologic: Alert and oriented x3. Deep tendon reflexes symmetrical and normal. Light touch over feet.          Skin: Intact without suspicious lesions or rashes. Psych: Mood and affect are normal. Normally interactive                                                                                         Assessment & Plan:  #1 dyslipidemia; the major risks of triglycerides over 500 with the potential for pancreatitis. LDL is improved significantly but is still not at goal of less than 100  #2 diabetes; uncontrolled but not as potentially serious a problem as #1  #3 hypertension controlled  Plan: I have recommended nutritional referral; he declines this at this time preferring to initiate an exercise program  The  trilipix  will be discontinued and WelChol substituted.  The labs be rechecked after 10 weeks of therapy.

## 2010-07-24 NOTE — Patient Instructions (Signed)
Preventive Health Care: Exercise at least 30-45 minutes a day,  3-4 days a week.  Eat a low-fat diet with lots of fruits and vegetables, up to 7-9 servings per day. Avoid obesity; your goal is waist measurement < 40 inches.Consume less than 40 grams of sugar per day from foods & drinks with High Fructose Corn Sugar as # 1,2,3 or # 4 on label..Follow the low carb nutrition program in [employer] as closely as possible to prevent Diabetes progression & complications.   Eye Doctor - have an eye exam @ least annually.                                                      NORMAL VALUES  Non diabetic adults: 5 %-6.1%  Good diabetic control: 6.2-6.4 %  Fair diabetic control: 6.5-7%  Poor diabetic control: greater than 7 % ( except with additional factors such as  advanced age; significant coronary or neurologic disease,etc). Check the A1c every 6 months if it is < 6.5%; every 4 months if  6.5% or higher. Goals for home glucose monitoring are : fasting  or morning glucose goal of  90-150. Two hours after any meal , goal = < 180, preferably < 150. Please  schedule fasting Labs in 10 weeks : BMET,Lipids, hepatic panel, TSH,  A1c, urine microalbumin

## 2010-07-24 NOTE — Progress Notes (Signed)
Addended by: Edgardo Roys on: 07/24/2010 09:05 AM   Modules accepted: Orders

## 2010-08-18 ENCOUNTER — Other Ambulatory Visit: Payer: Self-pay | Admitting: Internal Medicine

## 2010-08-20 ENCOUNTER — Other Ambulatory Visit: Payer: Self-pay | Admitting: Internal Medicine

## 2010-10-01 ENCOUNTER — Other Ambulatory Visit: Payer: Self-pay | Admitting: Internal Medicine

## 2010-10-01 DIAGNOSIS — E785 Hyperlipidemia, unspecified: Secondary | ICD-10-CM

## 2010-10-02 ENCOUNTER — Other Ambulatory Visit: Payer: BC Managed Care – PPO

## 2010-10-09 ENCOUNTER — Ambulatory Visit: Payer: BC Managed Care – PPO | Admitting: Internal Medicine

## 2010-10-15 ENCOUNTER — Other Ambulatory Visit: Payer: Self-pay | Admitting: Internal Medicine

## 2010-10-15 DIAGNOSIS — E785 Hyperlipidemia, unspecified: Secondary | ICD-10-CM

## 2010-10-16 ENCOUNTER — Other Ambulatory Visit (INDEPENDENT_AMBULATORY_CARE_PROVIDER_SITE_OTHER): Payer: BC Managed Care – PPO

## 2010-10-16 DIAGNOSIS — E785 Hyperlipidemia, unspecified: Secondary | ICD-10-CM

## 2010-10-16 DIAGNOSIS — IMO0001 Reserved for inherently not codable concepts without codable children: Secondary | ICD-10-CM

## 2010-10-16 LAB — BASIC METABOLIC PANEL
BUN: 17 mg/dL (ref 6–23)
GFR: 103.77 mL/min (ref >60.00)
Potassium: 4 mEq/L (ref 3.5–5.1)

## 2010-10-16 LAB — HEPATIC FUNCTION PANEL
AST: 24 U/L (ref 0–37)
Albumin: 4 g/dL (ref 3.5–5.2)
Alkaline Phosphatase: 69 U/L (ref 39–117)
Total Protein: 7.4 g/dL (ref 6.0–8.3)

## 2010-10-16 LAB — LIPID PANEL
Cholesterol: 305 mg/dL — ABNORMAL HIGH (ref 0–200)
Triglycerides: 1315 mg/dL — ABNORMAL HIGH (ref 0.0–149.0)
VLDL: 263 mg/dL — ABNORMAL HIGH (ref 0.0–40.0)

## 2010-10-16 LAB — MICROALBUMIN / CREATININE URINE RATIO
Creatinine,U: 134.1 mg/dL
Microalb Creat Ratio: 1 mg/g (ref 0.0–30.0)
Microalb, Ur: 1.4 mg/dL (ref 0.0–1.9)

## 2010-10-16 LAB — TSH: TSH: 0.79 u[IU]/mL (ref 0.35–5.50)

## 2010-10-16 LAB — HEMOGLOBIN A1C: Hgb A1c MFr Bld: 6.5 % (ref 4.6–6.5)

## 2010-10-16 NOTE — Progress Notes (Signed)
Labs only

## 2010-10-22 ENCOUNTER — Encounter: Payer: Self-pay | Admitting: Internal Medicine

## 2010-10-23 ENCOUNTER — Encounter: Payer: Self-pay | Admitting: Internal Medicine

## 2010-10-23 ENCOUNTER — Ambulatory Visit (INDEPENDENT_AMBULATORY_CARE_PROVIDER_SITE_OTHER): Payer: BC Managed Care – PPO | Admitting: Internal Medicine

## 2010-10-23 DIAGNOSIS — I10 Essential (primary) hypertension: Secondary | ICD-10-CM

## 2010-10-23 DIAGNOSIS — E119 Type 2 diabetes mellitus without complications: Secondary | ICD-10-CM

## 2010-10-23 DIAGNOSIS — E785 Hyperlipidemia, unspecified: Secondary | ICD-10-CM

## 2010-10-23 NOTE — Progress Notes (Signed)
  Subjective:    Patient ID: Derrick Singleton, male    DOB: 1945-09-14, 65 y.o.   MRN: [id number]  HPI HYPERTENSION: Disease Monitoring: Blood pressure range-120-130/80-90  Chest pain, palpitations- no      Dyspnea- no Medications: Compliance- yes  Lightheadedness,Syncope- some in am  2-3 hrs post meds    Edema- no  DIABETES: A1c 6.5 Disease Monitoring: Blood Sugar ranges-FBS 125-140  Polyuria/phagia/dipsia- no       Visual problems- no; Ophth exam neg 09/12 Medications: Compliance- yes  Hypoglycemic symptoms- no   HYPERLIPIDEMIA:LDL 77.7; TG 1315 Medications: Compliance- yes  Abd pain, bowel changes- no   Muscle aches- no        Review of Systems he did drink scotch 3-4 days  before labs     Objective:   Physical Exam Gen.: Healthy and well-nourished in appearance. Alert, appropriate and cooperative throughout exam. Eyes: No corneal or conjunctival inflammation noted. No icterus Mouth: Oral mucosa and oropharynx reveal no lesions or exudates. Teeth stained  Neck: No deformities, masses, or tenderness noted.  Thyroid normal. Lungs: Normal respiratory effort; chest expands symmetrically. Lungs are clear to auscultation without rales, wheezes, or increased work of breathing. Heart: Normal rate and rhythm. Normal S1 and S2. No gallop, click, or rub. Grade 1/6 R base systolic murmur. Abdomen: Bowel sounds normal; abdomen soft and nontender. No masses, organomegaly or hernias noted. Protuberant                                                                                   Musculoskeletal/extremities:  No clubbing, cyanosis, edema, or deformity noted.  Nail health  good. Vascular: Carotid, radial artery, dorsalis pedis and  posterior tibial pulses are full and equal. No bruits present. Neurologic: Alert and oriented x3. Deep tendon reflexes symmetrical and normal. Light touch normal over feet         Skin: Intact without suspicious lesions or rashes. Lymph: No cervical,  axillary  lymphadenopathy present. Psych: Mood and affect are normal. Normally interactive                                                                                         Assessment & Plan:  #1 DM , good control #2 HTN, controlled #3 LDL @ goal , BUT TG > 1300 with very high risk of pancreatitis Plan: See orders and recommendations

## 2010-10-23 NOTE — Patient Instructions (Addendum)
Preventive Health Care: Exercise at least 30-45 minutes a day,  3-4 days a week.  Eat a low-fat diet with lots of fruits and vegetables, up to 7-9 servings per day. Avoid obesity; your goal is waist measurement < 40 inches.Consume less than 40 grams of sugar per day from foods & drinks with High Fructose Corn Sugar as #1,2,3 or # 4 on label. Follow the low carb nutrition program in [employer] [facility] [employer] as closely as possible to prevent Diabetes progression & complications. White carbohydrates (potatoes, rice, bread, and pasta) have a high spike of sugar and a high load of sugar. For example a  baked potato has a cup of sugar and a  french fry  2 teaspoons of sugar. Yams, wild  rice, whole grained bread &  wheat pasta have been much lower spike and load of  sugar. Portions should be the size of a deck of cards or your palm.  Go to Web MD concerning Pancreatitis.To ER if pain  is associaled with Warning Signs as discussed.  Blood Pressure Goal  Ideally is an AVERAGE < 135/85. This AVERAGE should be calculated from @ least 5-7 BP readings taken @ different times of day on different days of week. You should not respond to isolated BP readings , but rather the AVERAGE for that week

## 2010-12-24 ENCOUNTER — Other Ambulatory Visit: Payer: Self-pay | Admitting: Internal Medicine

## 2010-12-24 ENCOUNTER — Telehealth: Payer: Self-pay | Admitting: Internal Medicine

## 2010-12-24 ENCOUNTER — Other Ambulatory Visit: Payer: Self-pay

## 2010-12-24 MED ORDER — INDOMETHACIN 50 MG PO CAPS: ORAL_CAPSULE | ORAL | Status: DC

## 2010-12-24 NOTE — Telephone Encounter (Signed)
Kendall from [facility] is requesting a new rx of Indomethacin be sent. Penni Bombard states patient is transferring from [facility].   [facility] on [address]. Phone# [phone]

## 2010-12-24 NOTE — Telephone Encounter (Signed)
RX sent, patient aware  

## 2010-12-24 NOTE — Telephone Encounter (Signed)
Indomethacin 50 mg 3 times a day with meals as needed dispense 21 no refill

## 2010-12-24 NOTE — Telephone Encounter (Signed)
Patient called and requested rx for Indomethacin (not on current med list), I reviewed [facility] and med was on previous list and removed 12/2009 (note stated patient not taking).  Last Uric Acid level 7.4 on 03/06/10.   Dr.Hopper please advise if ok to rx indomethacin 50 mg Cap for patient

## 2010-12-24 NOTE — Telephone Encounter (Signed)
DUPLICATE PHONE NOTE.

## 2010-12-31 ENCOUNTER — Ambulatory Visit (INDEPENDENT_AMBULATORY_CARE_PROVIDER_SITE_OTHER): Payer: BC Managed Care – PPO | Admitting: Internal Medicine

## 2010-12-31 ENCOUNTER — Encounter: Payer: Self-pay | Admitting: Internal Medicine

## 2010-12-31 ENCOUNTER — Other Ambulatory Visit: Payer: Self-pay | Admitting: Internal Medicine

## 2010-12-31 DIAGNOSIS — E785 Hyperlipidemia, unspecified: Secondary | ICD-10-CM

## 2010-12-31 DIAGNOSIS — M109 Gout, unspecified: Secondary | ICD-10-CM

## 2010-12-31 DIAGNOSIS — T887XXA Unspecified adverse effect of drug or medicament, initial encounter: Secondary | ICD-10-CM

## 2010-12-31 LAB — CK: Total CK: 77 U/L (ref 7–232)

## 2010-12-31 LAB — URIC ACID: Uric Acid, Serum: 10.2 mg/dL — ABNORMAL HIGH (ref 4.0–7.8)

## 2010-12-31 LAB — CREATININE, SERUM: Creatinine, Ser: 0.9 mg/dL (ref 0.4–1.5)

## 2010-12-31 LAB — TSH: TSH: 0.68 u[IU]/mL (ref 0.35–5.50)

## 2010-12-31 LAB — BUN: BUN: 19 mg/dL (ref 6–23)

## 2010-12-31 MED ORDER — COLCHICINE 0.6 MG PO TABS: 0.6000 mg | ORAL_TABLET | Freq: Two times a day (BID) | ORAL | Status: DC

## 2010-12-31 MED ORDER — INDOMETHACIN 50 MG PO CAPS: ORAL_CAPSULE | ORAL | Status: DC

## 2010-12-31 NOTE — Progress Notes (Signed)
  Subjective:    Patient ID: Derrick Singleton, male    DOB: [birth year], 65 y.o.   MRN: [id number]  HPI Extremity pain Location:L wrist but pain in R foot 2-3 weeks ago for several days Onset:12/16  Trigger/injury:no but Triglycerides were 1315 in 10/12 Pain quality:very sharp Pain severity:up to 10 Duration:all day, better in am , worse through day Radiation:no Exacerbating factors:use of hands Treatment/response:Ibuprofen 200 mg 4 pills 3 X/ day; Indocin 50 mg 3x/ day Review of systems: Constitutional: no fever, chills, sweats. 8# purposeful weight loss  Musculoskeletal:no  muscle cramps or pain. Joint stiffness, no redness but swollen Skin:no rash, color change Neuro: no weakness; numbness and tingling Heme:no lymphadenopathy; abnormal bruising or bleeding       Review of Systems   He saw Dr. Eligah East @ [facility] who made dietary and medication changes as noted.     Objective:   Physical Exam he is in no acute distress, but describes discomfort with any movement of the left wrist.  Skin appears normal but temperatures increased to touch.  Radial artery pulses are normal.  There is fusiform change of the left wrist; there is pain with rotation in either direction.  He has no  adenopathy in the epitrochlear area, axilla, or neck areas        Assessment & Plan:  #1 joint pain and swelling; the most likely etiology is gout  #2 dyslipidemia with extremely high triglycerides. Dietary and medication changes have been made  Plan: Lipids, uric acid, and renal function will be checked prior to initiating Colcrys

## 2010-12-31 NOTE — Patient Instructions (Signed)
.  Share results with Dr Eligah East.

## 2011-01-01 ENCOUNTER — Ambulatory Visit: Payer: BC Managed Care – PPO | Admitting: Internal Medicine

## 2011-01-16 ENCOUNTER — Telehealth: Payer: Self-pay | Admitting: Internal Medicine

## 2011-01-16 NOTE — Telephone Encounter (Signed)
Left message for patient to call me so we can schedule labs per Dr. Alwyn Ren. ALT,AST, A1C.  250.00, 995.20

## 2011-01-19 ENCOUNTER — Other Ambulatory Visit (INDEPENDENT_AMBULATORY_CARE_PROVIDER_SITE_OTHER): Payer: BC Managed Care – PPO

## 2011-01-19 ENCOUNTER — Ambulatory Visit (INDEPENDENT_AMBULATORY_CARE_PROVIDER_SITE_OTHER): Payer: BC Managed Care – PPO | Admitting: *Deleted

## 2011-01-19 DIAGNOSIS — T887XXA Unspecified adverse effect of drug or medicament, initial encounter: Secondary | ICD-10-CM

## 2011-01-19 DIAGNOSIS — Z23 Encounter for immunization: Secondary | ICD-10-CM

## 2011-01-19 LAB — AST: AST: 42 U/L — ABNORMAL HIGH (ref 0–37)

## 2011-01-26 ENCOUNTER — Telehealth: Payer: Self-pay | Admitting: Internal Medicine

## 2011-01-26 NOTE — Telephone Encounter (Signed)
Patient would like last labs faxed to him. Fax [phone]

## 2011-01-26 NOTE — Telephone Encounter (Signed)
Labs faxed

## 2011-01-27 ENCOUNTER — Telehealth: Payer: Self-pay | Admitting: *Deleted

## 2011-01-27 NOTE — Telephone Encounter (Signed)
Pt requesting lab results from 01.14.2013 be faxed to his work [phone] for appointment at [facility] tomorrow morning 01.23.2012. Done.

## 2011-01-29 ENCOUNTER — Other Ambulatory Visit: Payer: Self-pay | Admitting: Internal Medicine

## 2011-03-23 ENCOUNTER — Telehealth: Payer: Self-pay | Admitting: Internal Medicine

## 2011-03-23 NOTE — Telephone Encounter (Signed)
Refill for  Livalo 2MG  tab LILL  Qty 12 Take 1-tablet by mouth every Monday, Wednesday and Friday  Last fill date 2.17.13

## 2011-03-24 MED ORDER — PITAVASTATIN CALCIUM 2 MG PO TABS: ORAL_TABLET | ORAL | Status: DC

## 2011-03-24 NOTE — Telephone Encounter (Signed)
RX sent

## 2011-04-08 ENCOUNTER — Other Ambulatory Visit (INDEPENDENT_AMBULATORY_CARE_PROVIDER_SITE_OTHER): Payer: BC Managed Care – PPO

## 2011-04-08 DIAGNOSIS — E789 Disorder of lipoprotein metabolism, unspecified: Secondary | ICD-10-CM

## 2011-04-08 DIAGNOSIS — E785 Hyperlipidemia, unspecified: Secondary | ICD-10-CM

## 2011-04-08 LAB — URIC ACID: Uric Acid, Serum: 10.3 mg/dL — ABNORMAL HIGH (ref 4.0–7.8)

## 2011-04-08 LAB — AST: AST: 24 U/L (ref 0–37)

## 2011-04-08 LAB — LIPID PANEL
Total CHOL/HDL Ratio: 6
VLDL: 117.6 mg/dL — ABNORMAL HIGH (ref 0.0–40.0)

## 2011-04-09 LAB — LIPOPROTEIN A (LPA): Lipoprotein (a): <5 mg/dL (ref 0–30)

## 2011-04-17 ENCOUNTER — Other Ambulatory Visit: Payer: Self-pay | Admitting: Internal Medicine

## 2011-07-07 ENCOUNTER — Other Ambulatory Visit: Payer: Self-pay | Admitting: Internal Medicine

## 2011-07-10 ENCOUNTER — Other Ambulatory Visit (INDEPENDENT_AMBULATORY_CARE_PROVIDER_SITE_OTHER): Payer: BC Managed Care – PPO

## 2011-07-10 DIAGNOSIS — E782 Mixed hyperlipidemia: Secondary | ICD-10-CM

## 2011-07-10 DIAGNOSIS — IMO0001 Reserved for inherently not codable concepts without codable children: Secondary | ICD-10-CM

## 2011-07-10 LAB — ALT: ALT: 42 U/L (ref 0–53)

## 2011-07-10 LAB — AST: AST: 32 U/L (ref 0–37)

## 2011-07-10 LAB — LIPID PANEL
HDL: 45.6 mg/dL (ref >39.00)
Triglycerides: 476 mg/dL — ABNORMAL HIGH (ref 0.0–149.0)

## 2011-07-10 NOTE — Progress Notes (Signed)
Labs only

## 2011-10-07 ENCOUNTER — Telehealth: Payer: Self-pay | Admitting: Internal Medicine

## 2011-10-07 NOTE — Telephone Encounter (Signed)
OK if written Rx with codes brought in

## 2011-10-07 NOTE — Telephone Encounter (Signed)
Dr.Hopper please advise if you received this order and if you are willing to take responsibility for these labs requested by another facility

## 2011-10-07 NOTE — Telephone Encounter (Signed)
Called pt he will be in tomorrow at 845am

## 2011-10-07 NOTE — Telephone Encounter (Signed)
pt called stated he faxed lab orders from [facility] dr. yesterday and he would like to come in here for draw. Please review and advise & I will call pt back to set up  Cb# [phone]

## 2011-10-08 ENCOUNTER — Other Ambulatory Visit (INDEPENDENT_AMBULATORY_CARE_PROVIDER_SITE_OTHER): Payer: BC Managed Care – PPO

## 2011-10-08 DIAGNOSIS — E782 Mixed hyperlipidemia: Secondary | ICD-10-CM

## 2011-10-08 DIAGNOSIS — E119 Type 2 diabetes mellitus without complications: Secondary | ICD-10-CM

## 2011-10-08 LAB — BASIC METABOLIC PANEL
BUN: 18 mg/dL (ref 6–23)
CO2: 23 mEq/L (ref 19–32)
Chloride: 107 mEq/L (ref 96–112)
Creatinine, Ser: 1 mg/dL (ref 0.4–1.5)
Potassium: 3.9 mEq/L (ref 3.5–5.1)

## 2011-10-08 LAB — LIPID PANEL
Cholesterol: 230 mg/dL — ABNORMAL HIGH (ref 0–200)
HDL: 39.6 mg/dL (ref >39.00)
VLDL: 77.2 mg/dL — ABNORMAL HIGH (ref 0.0–40.0)

## 2011-10-08 LAB — MICROALBUMIN / CREATININE URINE RATIO: Microalb Creat Ratio: 1.4 mg/g (ref 0.0–30.0)

## 2011-10-08 LAB — URIC ACID: Uric Acid, Serum: 9.3 mg/dL — ABNORMAL HIGH (ref 4.0–7.8)

## 2011-10-08 LAB — HEMOGLOBIN A1C: Hgb A1c MFr Bld: 6.5 % (ref 4.6–6.5)

## 2011-10-08 LAB — LDL CHOLESTEROL, DIRECT: Direct LDL: 134.9 mg/dL

## 2011-10-12 ENCOUNTER — Encounter: Payer: Self-pay | Admitting: Internal Medicine

## 2011-10-13 ENCOUNTER — Encounter: Payer: Self-pay | Admitting: Internal Medicine

## 2011-10-14 ENCOUNTER — Encounter: Payer: Self-pay | Admitting: Internal Medicine

## 2011-10-15 ENCOUNTER — Other Ambulatory Visit: Payer: Self-pay | Admitting: Internal Medicine

## 2011-10-15 ENCOUNTER — Encounter: Payer: Self-pay | Admitting: Internal Medicine

## 2011-10-15 DIAGNOSIS — Z1211 Encounter for screening for malignant neoplasm of colon: Secondary | ICD-10-CM

## 2011-10-19 ENCOUNTER — Encounter: Payer: Self-pay | Admitting: Gastroenterology

## 2011-10-20 ENCOUNTER — Other Ambulatory Visit: Payer: Self-pay | Admitting: Internal Medicine

## 2011-11-30 ENCOUNTER — Other Ambulatory Visit: Payer: Self-pay | Admitting: Internal Medicine

## 2011-11-30 DIAGNOSIS — M109 Gout, unspecified: Secondary | ICD-10-CM

## 2011-11-30 MED ORDER — INDOMETHACIN 50 MG PO CAPS: ORAL_CAPSULE | ORAL | Status: DC

## 2011-11-30 NOTE — Telephone Encounter (Signed)
Hopp please advise, when last filled you only gave #21

## 2011-11-30 NOTE — Telephone Encounter (Signed)
#  21 ; 1 q 8 hrs prn only with food

## 2011-12-01 ENCOUNTER — Ambulatory Visit (INDEPENDENT_AMBULATORY_CARE_PROVIDER_SITE_OTHER): Payer: BC Managed Care – PPO | Admitting: Internal Medicine

## 2011-12-01 ENCOUNTER — Encounter: Payer: Self-pay | Admitting: Internal Medicine

## 2011-12-01 VITALS — BP 122/80 | HR 84 | Temp 98.0°F | Wt 175.0 lb

## 2011-12-01 DIAGNOSIS — M109 Gout, unspecified: Secondary | ICD-10-CM

## 2011-12-01 DIAGNOSIS — Z23 Encounter for immunization: Secondary | ICD-10-CM

## 2011-12-01 NOTE — Patient Instructions (Addendum)
Cheaper therapeutically equivalent Colchicine may be available from    [facility] [place] [phone] (toll-free). Take colchicine twice a day until the gout attack has resolved then decrease to once daily as prevention. Once you have been gout free for 4-6 weeks; start Uloric 80 mg one half pill daily. Recheck uric acid after 6 weeks of this prescription.Marland Kitchen PLEASE BRING THESE INSTRUCTIONS TO FOLLOW UP  LAB APPOINTMENT.This will guarantee correct labs are drawn, eliminating need for repeat blood sampling ( needle sticks ! ). Diagnoses /Codes: 274.9

## 2011-12-01 NOTE — Progress Notes (Signed)
  Subjective:    Patient ID: Derrick Singleton, male    DOB: Jun 03, 1945, 66 y.o.   MRN: [id number]  HPI  He developed acute gouty symptoms in the  ankle area bilaterally 11/18/11. He started leftover indomethacin with some response. He held his statin, colchicine, and allopurinol with the gout flare.  His most recent uric acid was 9.3 on 10/08/11. It has been as high as 10.3 in April of this year. Renal function has been normal with a creatinine of 1.0 and GFR of 82.8 to.  Triglycerides have ranged from 369 on 12/31/10 to a high of 588 in April of this year. The most recent triglycerides were 386 on 10/08/11.   His Colcrys costs $900 for 3 months   Review of Systems Constitutional: no fever, chills, sweats. 5# loss in weight with diet changes Musculoskeletal:no  muscle cramps or pain Skin:some redness   Neuro: no weakness; incontinence (stool/urine); numbness and tingling Heme:no lymphadenopathy; abnormal bruising or bleeding        Objective:   Physical Exam  He appears healthy and well-nourished in no acute distress.  Pedal pulses are intact.  There is visible swelling over the dorsum of the left foot and at the malleoli with tenderness to palpation.  There is no tenderness at the base of the left great toe        Assessment & Plan:  #1 gout, acute flare.  Plan: Colchicine should be restarted twice a day until the gout has resolved. He may be a candidate long-term for Uloric; samples given.

## 2011-12-02 ENCOUNTER — Telehealth: Payer: Self-pay | Admitting: Internal Medicine

## 2011-12-02 MED ORDER — COLCHICINE 0.6 MG PO TABS: ORAL_TABLET | ORAL | Status: DC

## 2011-12-02 NOTE — Telephone Encounter (Signed)
Appointment Request From: [name]      With Provider: Marga Melnick, MD [-Primary Care Physician-]      Preferred Date Range: From 12/07/2011 To 12/11/2011      Preferred Times: Tues Morning, Thur Morning, Fri Morning      Reason: To address the following health maintenance concerns.   Tetanus/Tdap   Colonoscopy   Zostavax   Influenza Vaccine      Comments:

## 2011-12-04 ENCOUNTER — Telehealth: Payer: Self-pay | Admitting: Internal Medicine

## 2011-12-04 ENCOUNTER — Encounter: Payer: Self-pay | Admitting: Internal Medicine

## 2011-12-04 NOTE — Telephone Encounter (Signed)
Sent My chart message advising ok on vaccines but PT needs to verify coverages prior to scheduling

## 2011-12-04 NOTE — Telephone Encounter (Signed)
Message copied by Verner Chol on Fri Dec 04, 2011  7:25 AM ------      Message from: Maurice Small      Created: Wed Dec 02, 2011  5:16 PM       Ok for patient to get Tdap, patient must check with insurance company about shingles vaccine       ----- Message -----         From: Theodis Sato McDaniels         Sent: 12/02/2011  10:58 AM           To: Maurice Small, CMA            She is wanting to get the following per her mychart schedule appointment message received today       She wants between 12/07/11-12/11/11      If vaccines are good to go I can go ahead and schedule those so If you can just look behind me and make sure she is due I would appreciate it. Also if you could see if [facility] will put in an order/referral for the colonoscopy that would be great       Thanks                  Reason: To address the following health maintenance concerns.        Tetanus/Tdap        Colonoscopy        Zostavax        Influenza Vaccine             ----- Message -----         From: Maurice Small, CMA         Sent: 12/02/2011  10:02 AM           To: Theodis Sato McDaniels            Please advise on what I am suppose to do about appointment information that you sent to me on this patient. First time I seen that

## 2011-12-14 ENCOUNTER — Encounter: Payer: BC Managed Care – PPO | Admitting: Gastroenterology

## 2012-03-04 ENCOUNTER — Other Ambulatory Visit (INDEPENDENT_AMBULATORY_CARE_PROVIDER_SITE_OTHER): Payer: BC Managed Care – PPO

## 2012-03-04 DIAGNOSIS — E782 Mixed hyperlipidemia: Secondary | ICD-10-CM

## 2012-03-04 LAB — CK: Total CK: 128 U/L (ref 7–232)

## 2012-03-04 LAB — LIPID PANEL
Cholesterol: 168 mg/dL (ref 0–200)
Total CHOL/HDL Ratio: 4
Triglycerides: 276 mg/dL — ABNORMAL HIGH (ref 0.0–149.0)

## 2012-03-04 LAB — TSH: TSH: 1.03 u[IU]/mL (ref 0.35–5.50)

## 2012-03-04 LAB — AST: AST: 25 U/L (ref 0–37)

## 2012-03-04 LAB — HEMOGLOBIN A1C: Hgb A1c MFr Bld: 6.1 % (ref 4.6–6.5)

## 2012-03-08 ENCOUNTER — Telehealth: Payer: Self-pay

## 2012-03-08 NOTE — Telephone Encounter (Signed)
Recent lab results faxed to Dr.Guyton @ [facility] [phone]

## 2012-04-12 ENCOUNTER — Encounter: Payer: Self-pay | Admitting: Internal Medicine

## 2012-04-13 ENCOUNTER — Encounter: Payer: Self-pay | Admitting: Internal Medicine

## 2012-06-23 ENCOUNTER — Encounter: Payer: Self-pay | Admitting: Family Medicine

## 2012-06-23 ENCOUNTER — Ambulatory Visit (INDEPENDENT_AMBULATORY_CARE_PROVIDER_SITE_OTHER): Payer: BC Managed Care – PPO | Admitting: Family Medicine

## 2012-06-23 VITALS — BP 100/70 | HR 89 | Temp 98.2°F | Wt 169.0 lb

## 2012-06-23 DIAGNOSIS — R197 Diarrhea, unspecified: Secondary | ICD-10-CM | POA: Insufficient documentation

## 2012-06-23 DIAGNOSIS — I10 Essential (primary) hypertension: Secondary | ICD-10-CM

## 2012-06-23 DIAGNOSIS — R490 Dysphonia: Secondary | ICD-10-CM | POA: Insufficient documentation

## 2012-06-23 MED ORDER — DIPHENOXYLATE-ATROPINE 2.5-0.025 MG PO TABS: ORAL_TABLET | ORAL | Status: DC

## 2012-06-23 NOTE — Progress Notes (Signed)
  Subjective:    Patient ID: Derrick Singleton, male    DOB: Dec 07, 1945, 67 y.o.   MRN: [id number]  HPI Diarrhea- sxs started Sunday AM.  Did not eat anything new or different.  Stool is watery.  Has had 6-7 BMs today.  Took Immodium w/out relief.  Has lost 3-4 lbs since Sunday.  No one else in family w/ diarrhea.  No fevers.  No abd pain.  No N/V.  + hoarseness.  No cough.  Denies PND.  HTN- pt has been working hard on diet and exercise and has lost 15 lbs.  Stopped Lisinopril and Amlodipine on Monday when BP was 90s/60s.   Review of Systems For ROS see HPI     Objective:   Physical Exam  Vitals reviewed. Constitutional: He is oriented to person, place, and time. He appears well-developed and well-nourished. No distress.  HENT:  Head: Normocephalic and atraumatic.  No TTP over sinuses + turbinate edema + PND TMs normal bilaterally  Eyes: Conjunctivae and EOM are normal. Pupils are equal, round, and reactive to light.  Neck: Normal range of motion. Neck supple.  Cardiovascular: Normal rate, regular rhythm and normal heart sounds.   Pulmonary/Chest: Effort normal and breath sounds normal. No respiratory distress. He has no wheezes.  Abdominal: Soft. Bowel sounds are normal. He exhibits no distension. There is no tenderness. There is no rebound and no guarding.  Lymphadenopathy:    He has no cervical adenopathy.  Neurological: He is alert and oriented to person, place, and time.  Skin: Skin is warm and dry.          Assessment & Plan:

## 2012-06-23 NOTE — Patient Instructions (Addendum)
We'll notify you of your lab results and make any changes if needed Make sure you are drinking plenty of fluids Use the Lomotil as needed for diarrhea REST! Eat as you are able Start Claritin or Zyrtec daily for the post-nasal drip, this is what's causing your hoarseness Call with any questions or concerns Hang in there!

## 2012-06-24 LAB — BASIC METABOLIC PANEL
CO2: 21 mEq/L (ref 19–32)
Calcium: 9.8 mg/dL (ref 8.4–10.5)
GFR: 75.41 mL/min (ref >60.00)
Sodium: 136 mEq/L (ref 135–145)

## 2012-06-24 LAB — CBC WITH DIFFERENTIAL/PLATELET
Eosinophils Absolute: 0.2 10*3/uL (ref 0.0–0.7)
Lymphocytes Relative: 28.9 % (ref 12.0–46.0)
MCHC: 33.2 g/dL (ref 30.0–36.0)
MCV: 88.4 fl (ref 78.0–100.0)
Monocytes Absolute: 0.7 10*3/uL (ref 0.1–1.0)
Neutrophils Relative %: 61.2 % (ref 43.0–77.0)
Platelets: 297 10*3/uL (ref 150.0–400.0)
WBC: 9.7 10*3/uL (ref 4.5–10.5)

## 2012-06-27 ENCOUNTER — Encounter: Payer: Self-pay | Admitting: *Deleted

## 2012-07-14 NOTE — Assessment & Plan Note (Signed)
New.  Most likely due to presence of PND.  Reviewed treatments and encouraged him to use them daily.  Will follow for improvement.

## 2012-07-14 NOTE — Assessment & Plan Note (Signed)
New.  Suspect viral illness but no other family members are ill.  Check labs to r/o bacterial infxn or electrolyte disturbance.  Start Lomotil prn.  Reviewed supportive care and red flags that should prompt return.  Pt expressed understanding and is in agreement w/ plan.

## 2012-07-14 NOTE — Assessment & Plan Note (Signed)
Pt has lost weight and now w/ recent diarrheal illness, BP is running low.  Agree w/ holding BP meds at this time.  Will follow.

## 2012-09-29 ENCOUNTER — Other Ambulatory Visit: Payer: Self-pay | Admitting: Family Medicine

## 2012-09-30 NOTE — Telephone Encounter (Signed)
I reviewed the medical records; he was seen for what was presumed to be acute viral gastroenteritis with diarrhea in June. If he is continuing to have diarrhea; he needs additional workup to rule out other processes such as parasites or colitis. He can be seen here or we can make a referral to GI if diarrhea has persisted. For minor diarrhea Imodium AD  should suffice.

## 2012-09-30 NOTE — Telephone Encounter (Signed)
Last visit: 06/23/12  Last filled: 06/23/12 #45, 0 refills  Is it ok to fill?  Please advise, SW

## 2012-10-19 ENCOUNTER — Other Ambulatory Visit: Payer: Self-pay | Admitting: Internal Medicine

## 2012-10-19 MED ORDER — PITAVASTATIN CALCIUM 2 MG PO TABS: 2.0000 mg | ORAL_TABLET | Freq: Every day | ORAL | Status: DC

## 2012-10-19 NOTE — Telephone Encounter (Signed)
Samples of Livalo X 1 month if available

## 2012-11-08 ENCOUNTER — Other Ambulatory Visit: Payer: Self-pay | Admitting: Internal Medicine

## 2012-11-09 ENCOUNTER — Other Ambulatory Visit: Payer: Self-pay | Admitting: Internal Medicine

## 2012-11-10 ENCOUNTER — Other Ambulatory Visit: Payer: Self-pay | Admitting: Internal Medicine

## 2012-11-10 ENCOUNTER — Telehealth: Payer: Self-pay | Admitting: *Deleted

## 2012-11-10 ENCOUNTER — Other Ambulatory Visit: Payer: Self-pay | Admitting: *Deleted

## 2012-11-10 ENCOUNTER — Other Ambulatory Visit: Payer: Self-pay

## 2012-11-10 DIAGNOSIS — M109 Gout, unspecified: Secondary | ICD-10-CM

## 2012-11-10 MED ORDER — COLCHICINE 0.6 MG PO TABS: ORAL_TABLET | ORAL | Status: DC

## 2012-11-10 NOTE — Telephone Encounter (Signed)
Spoke with patient concerning lab appt. Appt made

## 2012-11-10 NOTE — Telephone Encounter (Signed)
Livalo refill sent to pharamcy

## 2012-11-10 NOTE — Telephone Encounter (Signed)
#   30 ; he needs uric acid ,CK, AST ,ALT fasting Last seen for this 10/13 Code 274.9, 995.20

## 2012-11-10 NOTE — Telephone Encounter (Signed)
Called and left message for patient to please give [place] a call back so that fasting labs can be scheduled. Orders for Uric Acid, ALT, AST and CK were entered in.

## 2012-11-10 NOTE — Telephone Encounter (Signed)
Done . Pt notified.  

## 2012-11-10 NOTE — Telephone Encounter (Signed)
Last seen 12/01/11 and Uric Acid done on 10/08/11. Please advise if refill is appropriate.       KP

## 2012-11-13 ENCOUNTER — Other Ambulatory Visit: Payer: Self-pay | Admitting: Internal Medicine

## 2012-11-13 DIAGNOSIS — E785 Hyperlipidemia, unspecified: Secondary | ICD-10-CM

## 2012-11-13 DIAGNOSIS — I10 Essential (primary) hypertension: Secondary | ICD-10-CM

## 2012-11-13 DIAGNOSIS — M109 Gout, unspecified: Secondary | ICD-10-CM

## 2012-11-13 DIAGNOSIS — E119 Type 2 diabetes mellitus without complications: Secondary | ICD-10-CM

## 2012-11-15 ENCOUNTER — Other Ambulatory Visit: Payer: BC Managed Care – PPO

## 2012-11-17 ENCOUNTER — Other Ambulatory Visit: Payer: Medicare Other

## 2012-11-17 ENCOUNTER — Other Ambulatory Visit: Payer: Self-pay | Admitting: Internal Medicine

## 2012-11-17 DIAGNOSIS — M109 Gout, unspecified: Secondary | ICD-10-CM

## 2012-11-17 DIAGNOSIS — E119 Type 2 diabetes mellitus without complications: Secondary | ICD-10-CM

## 2012-11-17 DIAGNOSIS — E785 Hyperlipidemia, unspecified: Secondary | ICD-10-CM

## 2012-11-17 LAB — HEMOGLOBIN A1C: Hgb A1c MFr Bld: 6.3 % (ref 4.6–6.5)

## 2012-11-17 LAB — ALT: ALT: 31 U/L (ref 0–53)

## 2012-11-17 LAB — MICROALBUMIN / CREATININE URINE RATIO
Creatinine,U: 43.2 mg/dL
Microalb Creat Ratio: 0.9 mg/g (ref 0.0–30.0)

## 2012-11-17 LAB — CK: Total CK: 133 U/L (ref 7–232)

## 2012-11-17 LAB — TSH: TSH: 0.54 u[IU]/mL (ref 0.35–5.50)

## 2012-11-18 ENCOUNTER — Encounter: Payer: Self-pay | Admitting: Internal Medicine

## 2012-11-18 LAB — NMR LIPOPROFILE WITH LIPIDS
Cholesterol, Total: 160 mg/dL (ref <200)
HDL-C: 37 mg/dL — ABNORMAL LOW (ref >=40)
LDL (calc): 82 mg/dL (ref <100)
LDL Particle Number: 1487 nmol/L — ABNORMAL HIGH (ref <1000)
LDL Size: 20.6 nm (ref >20.5)
Large VLDL-P: 6.5 nmol/L — ABNORMAL HIGH (ref <=2.7)
Small LDL Particle Number: 741 nmol/L — ABNORMAL HIGH (ref <=527)
VLDL Size: 47.9 nm — ABNORMAL HIGH (ref <=46.6)

## 2012-12-02 ENCOUNTER — Telehealth: Payer: Self-pay | Admitting: *Deleted

## 2012-12-02 NOTE — Telephone Encounter (Signed)
Called and spoke with pt regarding upcoming CPE appt on 12/13/12 @ 9:30 am. He was instructed to please bring all pill bottles. He agreed and stated he would like his flu shot that day as well.

## 2012-12-13 ENCOUNTER — Encounter: Payer: Self-pay | Admitting: Internal Medicine

## 2012-12-13 ENCOUNTER — Ambulatory Visit (INDEPENDENT_AMBULATORY_CARE_PROVIDER_SITE_OTHER): Payer: Medicare Other | Admitting: Internal Medicine

## 2012-12-13 VITALS — BP 134/85 | HR 77 | Temp 98.1°F | Resp 13 | Ht 69.0 in | Wt 176.0 lb

## 2012-12-13 DIAGNOSIS — I10 Essential (primary) hypertension: Secondary | ICD-10-CM

## 2012-12-13 DIAGNOSIS — E785 Hyperlipidemia, unspecified: Secondary | ICD-10-CM

## 2012-12-13 DIAGNOSIS — Z23 Encounter for immunization: Secondary | ICD-10-CM

## 2012-12-13 DIAGNOSIS — M109 Gout, unspecified: Secondary | ICD-10-CM

## 2012-12-13 DIAGNOSIS — E119 Type 2 diabetes mellitus without complications: Secondary | ICD-10-CM

## 2012-12-13 MED ORDER — GLUCOSE BLOOD VI STRP: ORAL_STRIP | Status: DC

## 2012-12-13 NOTE — Assessment & Plan Note (Signed)
A1c in 4 mos

## 2012-12-13 NOTE — Progress Notes (Signed)
Subjective:    Patient ID: Derrick Singleton, male    DOB: 06-11-1945, 67 y.o.   MRN: [id number]  HPI    He returns to followup the glucose intolerance, hypertension, and dyslipidemia. Current labs were reviewed and medication list updated.  His recent advanced cholesterol panel revealed an LDL of 82 with 1487 total particles which is an approximate 15% long-term risk. Triglycerides have improved dramatically and now are 203.LP-IR is 75. This value with the TG of 203 indicate a prediabetic status. The A1c is 6.3%. Urine microalbumin was normal  Normal studies included his liver function tests, CK, and uric acid.    Review of Systems A low carb,heart healthy diet is followed;he has lost 23 #. Exercise encompasses 30 minutes 6-7  times per week as yoga & breathing exercises without symptoms.  Family history is negative for premature coronary disease. Advanced cholesterol testing reveals  LDL goal is less than 100 ; ideally < 70 . There is medication compliance with the statin; as of 11/28/12 he was switched from Livalo due to cost to Lipitor 10 mg one half pill Monday, Wednesday, and Friday. Low dose ASA taken  Specifically denied are  chest pain, palpitations, dyspnea, or claudication.  Significant abdominal symptoms, memory deficit, or myalgias not present.  Fasting glucoses are 110-115. Highest glucose 2 hours after meal is 100. He denies polyuria, polyphagia, polydipsia. He denies any nonhealing skin lesions, numbness, tingling, or burning in his feet.  He has been taking Colcrys twice a day to prevent gout. When he stopped it he has had a flare of pain in the left wrist.    Objective:   Physical Exam  Gen.: Healthy and well-nourished in appearance. Alert, appropriate and cooperative throughout exam.Appears younger than stated age  Head: Normocephalic without obvious abnormalities  Eyes: No corneal or conjunctival inflammation noted. Pupils equal round reactive to light and accommodation.  Extraocular motion intact.  Mouth: Oral mucosa and oropharynx reveal no lesions or exudates. Teeth in good repair. Neck: No deformities, masses, or tenderness noted.  Thyroid normal. Lungs: Normal respiratory effort; chest expands symmetrically. Lungs are clear to auscultation without rales, wheezes, or increased work of breathing. Heart: Normal rate and rhythm. Normal S1 and S2. No gallop, click, or rub. S 4 w/o murmur. Abdomen: Bowel sounds normal; abdomen soft and nontender. No masses, organomegaly or hernias noted.                                 Musculoskeletal/extremities:  No clubbing, cyanosis, edema, or significant extremity  deformity noted. Range of motion normal .Tone & strength normal. Hand joints normal . Fingernail / toenail health good. Able to lie down & sit up w/o help.  Vascular: Carotid, radial artery, dorsalis pedis and  posterior tibial pulses are full and equal. No bruits present. Neurologic: Alert and oriented x3. Deep tendon reflexes symmetrical and normal.        Skin: Intact without suspicious lesions or rashes. Lymph: No cervical, axillary, or inguinal lymphadenopathy present. Psych: Mood and affect are normal. Normally interactive  Assessment & Plan:  See Current Assessment & Plan in Problem List under specific Diagnosis

## 2012-12-13 NOTE — Assessment & Plan Note (Signed)
No change in meds.

## 2012-12-13 NOTE — Assessment & Plan Note (Addendum)
Decrease Colcrys to 1 qd Uric acid in 4 mos

## 2012-12-13 NOTE — Patient Instructions (Addendum)
Your next office appointment will be determined based upon review of your pending labs 02/25/13. Those instructions will be transmitted to you through My Chart  .Share results with Dr Eligah East

## 2012-12-13 NOTE — Progress Notes (Signed)
Pre visit review using our clinic review tool, if applicable. No additional management support is needed unless otherwise documented below in the visit note. 

## 2012-12-13 NOTE — Assessment & Plan Note (Addendum)
Lipids, LFTs,CK in 10 weeks on low dose intermittent Atorvastatin

## 2013-02-09 ENCOUNTER — Encounter: Payer: Self-pay | Admitting: Internal Medicine

## 2013-02-14 ENCOUNTER — Other Ambulatory Visit (INDEPENDENT_AMBULATORY_CARE_PROVIDER_SITE_OTHER): Payer: Medicare Other

## 2013-02-14 DIAGNOSIS — M109 Gout, unspecified: Secondary | ICD-10-CM

## 2013-02-14 DIAGNOSIS — E785 Hyperlipidemia, unspecified: Secondary | ICD-10-CM

## 2013-02-14 LAB — HEPATIC FUNCTION PANEL
ALT: 25 U/L (ref 0–53)
AST: 22 U/L (ref 0–37)
Albumin: 3.8 g/dL (ref 3.5–5.2)
Alkaline Phosphatase: 48 U/L (ref 39–117)
BILIRUBIN DIRECT: 0 mg/dL (ref 0.0–0.3)
BILIRUBIN TOTAL: 0.6 mg/dL (ref 0.3–1.2)
Total Protein: 7.3 g/dL (ref 6.0–8.3)

## 2013-02-14 LAB — LIPID PANEL
CHOLESTEROL: 191 mg/dL (ref 0–200)
HDL: 47.5 mg/dL (ref >39.00)
LDL CALC: 115 mg/dL — AB (ref 0–99)
Total CHOL/HDL Ratio: 4
Triglycerides: 145 mg/dL (ref 0.0–149.0)
VLDL: 29 mg/dL (ref 0.0–40.0)

## 2013-02-14 LAB — URIC ACID: URIC ACID, SERUM: 5.3 mg/dL (ref 4.0–7.8)

## 2013-02-14 LAB — CK: Total CK: 102 U/L (ref 7–232)

## 2013-03-03 ENCOUNTER — Ambulatory Visit (INDEPENDENT_AMBULATORY_CARE_PROVIDER_SITE_OTHER): Payer: Medicare Other | Admitting: Internal Medicine

## 2013-03-03 ENCOUNTER — Other Ambulatory Visit: Payer: Self-pay | Admitting: Internal Medicine

## 2013-03-03 ENCOUNTER — Other Ambulatory Visit (INDEPENDENT_AMBULATORY_CARE_PROVIDER_SITE_OTHER): Payer: Medicare Other

## 2013-03-03 ENCOUNTER — Ambulatory Visit (INDEPENDENT_AMBULATORY_CARE_PROVIDER_SITE_OTHER)
Admission: RE | Admit: 2013-03-03 | Discharge: 2013-03-03 | Disposition: A | Discharge status: Home / Self Care | Payer: Medicare Other | Source: Ambulatory Visit | Attending: Internal Medicine | Admitting: Internal Medicine

## 2013-03-03 ENCOUNTER — Encounter: Payer: Self-pay | Admitting: Internal Medicine

## 2013-03-03 VITALS — BP 130/80 | HR 110 | Temp 97.4°F | Resp 14 | Wt 183.4 lb

## 2013-03-03 DIAGNOSIS — R918 Other nonspecific abnormal finding of lung field: Secondary | ICD-10-CM | POA: Insufficient documentation

## 2013-03-03 DIAGNOSIS — R0781 Pleurodynia: Secondary | ICD-10-CM

## 2013-03-03 DIAGNOSIS — R079 Chest pain, unspecified: Secondary | ICD-10-CM

## 2013-03-03 DIAGNOSIS — R0989 Other specified symptoms and signs involving the circulatory and respiratory systems: Secondary | ICD-10-CM

## 2013-03-03 DIAGNOSIS — R071 Chest pain on breathing: Secondary | ICD-10-CM

## 2013-03-03 DIAGNOSIS — R9431 Abnormal electrocardiogram [ECG] [EKG]: Secondary | ICD-10-CM

## 2013-03-03 DIAGNOSIS — R0689 Other abnormalities of breathing: Secondary | ICD-10-CM

## 2013-03-03 LAB — CBC WITH DIFFERENTIAL/PLATELET
BASOS PCT: 0.5 % (ref 0.0–3.0)
Basophils Absolute: 0 10*3/uL (ref 0.0–0.1)
EOS ABS: 0.3 10*3/uL (ref 0.0–0.7)
EOS PCT: 4.2 % (ref 0.0–5.0)
HEMATOCRIT: 40.3 % (ref 39.0–52.0)
Hemoglobin: 13.3 g/dL (ref 13.0–17.0)
LYMPHS ABS: 2.1 10*3/uL (ref 0.7–4.0)
Lymphocytes Relative: 28.3 % (ref 12.0–46.0)
MCHC: 33 g/dL (ref 30.0–36.0)
MCV: 87.2 fl (ref 78.0–100.0)
MONO ABS: 0.9 10*3/uL (ref 0.1–1.0)
Monocytes Relative: 12 % (ref 3.0–12.0)
Neutro Abs: 4.1 10*3/uL (ref 1.4–7.7)
Neutrophils Relative %: 55 % (ref 43.0–77.0)
Platelets: 487 10*3/uL — ABNORMAL HIGH (ref 150.0–400.0)
RBC: 4.61 Mil/uL (ref 4.22–5.81)
RDW: 13.4 % (ref 11.5–14.6)
WBC: 7.4 10*3/uL (ref 4.5–10.5)

## 2013-03-03 MED ORDER — TRAMADOL HCL 50 MG PO TABS: 50.0000 mg | ORAL_TABLET | Freq: Four times a day (QID) | ORAL | Status: DC | PRN

## 2013-03-03 NOTE — Patient Instructions (Signed)
Your next office appointment will be determined based upon review of your pending labs & x-rays. Those instructions will be transmitted to you through My Chart . Followup as needed for your acute issue. Please report any significant change in your symptoms. Take the EKG to any emergency room or preop visits. There are nonspecific changes; as long as there is no new change these are not clinically significant . If the old EKG is not available for comparison; it may result in unnecessary hospitalization for observation with significant unnecessary expense.

## 2013-03-03 NOTE — Progress Notes (Signed)
Pre visit review using our clinic review tool, if applicable. No additional management support is needed unless otherwise documented below in the visit note. 

## 2013-03-03 NOTE — Progress Notes (Signed)
Subjective:    Patient ID: Derrick Singleton, male    DOB: 1945-12-26, 68 y.o.   MRN: [id number]  HPI   His symptoms began 02/26/13 as an aching discomfort in the left upper chest with no trigger or trauma. The symptoms last up to 5 minutes and are exacerbated by walking, deep breathing, or coughing.  He's also had associated exertional dyspnea  He's had minor dyspepsia.  With the chest discomfort he is having left upper quadrant discomfort as well  He is taking DayQuil and NyQuil without significant response. Aleve did help the discomfort      Former smoker  ; quit in 2008.Up to 1 ppd X 65 years Father had lung cancer. Review of Systems He denies a significant cough, sputum production, or hemoptysis. No cough  He also denies fever, chills, or sweats  He has taken Zyrtec; he denies having extrinsic symptoms of itchy, watery eyes, or sneezing.  He denies swelling of his feet or pain in calves  He's had no upper respiratory tract symptoms of rhinosinusitis.      Objective:   Physical Exam Gen.: Healthy and well-nourished in appearance. Alert, appropriate and cooperative throughout exam. Head: Normocephalic without obvious abnormalities Eyes: No corneal or conjunctival inflammation noted.  Ears: External  ear exam reveals no significant lesions or deformities. Canals clear .TMs normal. Hearing is grossly normal bilaterally. Nose: External nasal exam reveals no deformity or inflammation. Nasal mucosa are pink and moist. No lesions or exudates noted.   Mouth: Oral mucosa and oropharynx reveal no lesions or exudates. Teeth in good repair. Neck: No deformities, masses, or tenderness noted. Range of motion normal. Lungs: Normal respiratory effort; chest expands symmetrically. Lungs are clear to auscultation without rales, wheezes, or increased work of breathing. Breath sounds are decreased in the left upper lobe anteriorly and posteriorly. There no E to A changes or change in tactile  fremitus Heart: Slightly fast  rate and  Regular rhythm. Normal S1 and S2. No gallop, click, or rub.No murmur. P recheck 80 Abdomen: Bowel sounds normal; abdomen soft and nontender. No masses, organomegaly or hernias noted.                                  Musculoskeletal/extremities:No clubbing, cyanosis, edema, or significant extremity  deformity noted.Tone & strength normal. Hand joints normal  Fingernail  health good. Able to lie down & sit up w/o help. Negative SLR bilaterally Vascular: Carotid, radial artery, dorsalis pedis and  posterior tibial pulses are full and equal. No bruits present. Homan's negative Neurologic: Alert and oriented x3. Deep tendon reflexes symmetrical and normal.   Skin: Intact without suspicious lesions or rashes. Lymph: No cervical, axillary lymphadenopathy present. Psych: Mood and affect are normal. Normally interactive                                                                                        Assessment & Plan:  #1 atypical chest pain with pleuritic components. Decreased BS LUL. Former smoker 40 years  EKG reveals low voltage generally & nonspecific ST-T changes  Plan:  See orders

## 2013-03-04 LAB — TROPONIN I: Troponin I: 0.02 ng/mL (ref <0.06)

## 2013-03-06 ENCOUNTER — Other Ambulatory Visit: Payer: Self-pay | Admitting: *Deleted

## 2013-03-06 ENCOUNTER — Other Ambulatory Visit: Payer: Medicare Other

## 2013-03-06 DIAGNOSIS — R079 Chest pain, unspecified: Secondary | ICD-10-CM

## 2013-03-06 DIAGNOSIS — R071 Chest pain on breathing: Secondary | ICD-10-CM

## 2013-03-07 LAB — D-DIMER, QUANTITATIVE (NOT AT ARMC): D DIMER QUANT: 1.12 ug{FEU}/mL — AB (ref 0.00–0.48)

## 2013-03-08 ENCOUNTER — Other Ambulatory Visit (INDEPENDENT_AMBULATORY_CARE_PROVIDER_SITE_OTHER): Payer: Medicare Other

## 2013-03-08 ENCOUNTER — Encounter: Payer: Self-pay | Admitting: Emergency Medicine

## 2013-03-08 ENCOUNTER — Ambulatory Visit (INDEPENDENT_AMBULATORY_CARE_PROVIDER_SITE_OTHER): Payer: Medicare Other | Admitting: Emergency Medicine

## 2013-03-08 VITALS — BP 120/78 | HR 122 | Ht 68.0 in | Wt 185.0 lb

## 2013-03-08 DIAGNOSIS — R918 Other nonspecific abnormal finding of lung field: Secondary | ICD-10-CM

## 2013-03-08 DIAGNOSIS — J90 Pleural effusion, not elsewhere classified: Secondary | ICD-10-CM

## 2013-03-08 LAB — BASIC METABOLIC PANEL
BUN: 17 mg/dL (ref 6–23)
CO2: 22 mEq/L (ref 19–32)
Calcium: 9.2 mg/dL (ref 8.4–10.5)
Chloride: 96 mEq/L (ref 96–112)
Creatinine, Ser: 0.9 mg/dL (ref 0.4–1.5)
GFR: 88.76 mL/min (ref >60.00)
GLUCOSE: 125 mg/dL — AB (ref 70–99)
POTASSIUM: 4.2 meq/L (ref 3.5–5.1)
SODIUM: 131 meq/L — AB (ref 135–145)

## 2013-03-08 LAB — LACTATE DEHYDROGENASE: LDH: 156 U/L (ref 94–250)

## 2013-03-08 NOTE — Addendum Note (Signed)
Addended by: Carlos American A on: 03/08/2013 04:53 PM   Modules accepted: Orders

## 2013-03-08 NOTE — Patient Instructions (Addendum)
We will perform a CT scan of your chest  We will perform a left thoracentesis  You will be called with your test results Blood work today Follow with Dr Lamonte Sakai next available appointment.

## 2013-03-08 NOTE — Progress Notes (Signed)
Subjective:    Patient ID: Derrick Singleton, male    DOB: 12/30/1945, 68 y.o.   MRN: [id number]  HPI 68 yo man, former smoker (15 pk-yrs), Hx HTN (on ACE-I), gout, hyperlipidemia. Followed by Dr Linna Darner and referred for dyspnea and abnormal CXR done 03/03/13 > shows moderate to large L effusion with LLL atx/infiltrate + additional LUL nodular disease. He began to have acute SOB on 2/22 with some L pleuritic CP. The CP has improved but he remains dyspneic. He has had cough for a week, non-productive. No hemoptysis. He has gained a few lbs over the last few weeks, but he indicates poor appetite. His stomach feels full, distended. He has had some constipation.    Recent Labs Lab 03/03/13 1304  HGB 13.3  HCT 40.3  WBC 7.4  PLT 487.0*   D-dimer 1.12 (sliightly elevated)  Review of Systems  Constitutional: Positive for unexpected weight change. Negative for fever.  HENT: Positive for dental problem. Negative for congestion, ear pain, nosebleeds, postnasal drip, rhinorrhea, sinus pressure, sneezing, sore throat and trouble swallowing.   Eyes: Negative for redness and itching.  Respiratory: Positive for cough, chest tightness, shortness of breath and wheezing.   Cardiovascular: Negative for palpitations and leg swelling.  Gastrointestinal: Negative for nausea and vomiting.  Genitourinary: Negative for dysuria.  Musculoskeletal: Negative for joint swelling.  Skin: Negative for rash.  Neurological: Negative for headaches.  Hematological: Does not bruise/bleed easily.  Psychiatric/Behavioral: Negative for dysphoric mood. The patient is not nervous/anxious.    Past Medical History  Diagnosis Date  . Gout   . Hyperlipemia   . Hypertension   . Low testosterone      Family History  Problem Relation Age of Onset  . Lung cancer Father     smoker  . Heart disease Neg Hx   . Stroke Neg Hx   . Diabetes Neg Hx      History   Social History  . Marital Status: Married    Spouse Name: N/A     Number of Children: N/A  . Years of Education: N/A   Occupational History  . Not on file.   Social History Main Topics  . Smoking status: Former Smoker -- 0.50 packs/day for 30 years    Types: Cigarettes    Quit date: 01/05/2006  . Smokeless tobacco: Not on file     Comment: smoked  1968 - 2008, up to 1  ppd  . Alcohol Use: Yes     Comment:  socially  . Drug Use: No  . Sexual Activity: Not on file   Other Topics Concern  . Not on file   Social History Narrative  . No narrative on file     No Known Allergies   Outpatient Prescriptions Prior to Visit  Medication Sig Dispense Refill  . allopurinol (ZYLOPRIM) 100 MG tablet Take 100 mg by mouth. 1/2 BY MOUTH X 2 MONTHS THEN INCREASE TO 1 BY MOUTH DAILY      . amLODipine (NORVASC) 5 MG tablet TAKE ONE TABLET BY MOUTH ONE TIME DAILY  90 tablet  2  . aspirin EC 81 MG tablet Take 81 mg by mouth daily.      Marland Kitchen atorvastatin (LIPITOR) 10 MG tablet       . colchicine 0.6 MG tablet Take 0.6 mg by mouth 2 (two) times daily.      . fenofibrate 160 MG tablet Take 160 mg by mouth daily.      . fish oil-omega-3 fatty  acids 1000 MG capsule Take 2 g by mouth. 2 by mouth in the am, 2 by mouth in the pm      . glucose blood (ONE TOUCH ULTRA TEST) test strip Check blood sugar once daily  100 each  3  . glucose blood test strip Test blood sugar daily. Dx Code: 250.00. OneTouch Verio IQ strips  100 each  12  . indomethacin (INDOCIN) 50 MG capsule 1 q 8 hrs prn only with food  21 capsule  0  . lisinopril (PRINIVIL,ZESTRIL) 20 MG tablet Take 20 mg by mouth daily.      . metFORMIN (GLUCOPHAGE) 500 MG tablet Take by mouth 2 (two) times daily with a meal.      . ONETOUCH DELICA LANCETS MISC Check blood sugar once daily  100 each  3  . traMADol (ULTRAM) 50 MG tablet Take 1 tablet (50 mg total) by mouth every 6 (six) hours as needed.  30 tablet  0  . TURMERIC PO Take by mouth 2 (two) times daily.       No facility-administered medications prior to visit.        Objective:   Physical Exam Filed Vitals:   03/08/13 1555  BP: 120/78  Pulse: 122  Height: 5\' 8"  (1.727 m)  Weight: 185 lb (83.915 kg)  SpO2: 97%   Gen: Pleasant, well-nourished, in no distress,  normal affect  ENT: No lesions,  mouth clear,  oropharynx clear, no postnasal drip  Neck: No JVD, no TMG, no carotid bruits  Lungs: No use of accessory muscles, very decreased L base, clear on R without rales or rhonchi  Cardiovascular: RRR, heart sounds normal, no murmur or gallops, no peripheral edema  Musculoskeletal: No deformities, no cyanosis or clubbing  Neuro: alert, non focal  Skin: Warm, no lesions or rashes      Assessment & Plan:  Abnormality of lung on chest x-ray Acute dyspnea and CP a weeks ago. CXR shows L effusion and rounded LUL lesion, probably a mass. Consider also a pulm infarct and effusion due to PE given CP as the initial symptom. I unfortunately suspect that this is lung CA.  - will check CT scan chest now to better eval the LUL mass and to definitively r/o PE - will perform a L thoracentesis.  - walking oximetry today - rov 1

## 2013-03-08 NOTE — Assessment & Plan Note (Signed)
Acute dyspnea and CP a weeks ago. CXR shows L effusion and rounded LUL lesion, probably a mass. Consider also a pulm infarct and effusion due to PE given CP as the initial symptom. I unfortunately suspect that this is lung CA.  - will check CT scan chest now to better eval the LUL mass and to definitively r/o PE - will perform a L thoracentesis.  - walking oximetry today - rov 1

## 2013-03-08 NOTE — Addendum Note (Signed)
Addended by: Carlos American A on: 03/08/2013 05:23 PM   Modules accepted: Orders

## 2013-03-09 ENCOUNTER — Telehealth: Payer: Self-pay | Admitting: Emergency Medicine

## 2013-03-09 ENCOUNTER — Ambulatory Visit (INDEPENDENT_AMBULATORY_CARE_PROVIDER_SITE_OTHER)
Admission: RE | Admit: 2013-03-09 | Discharge: 2013-03-09 | Disposition: A | Discharge status: Home / Self Care | Payer: Medicare Other | Source: Ambulatory Visit | Attending: Emergency Medicine | Admitting: Emergency Medicine

## 2013-03-09 DIAGNOSIS — R918 Other nonspecific abnormal finding of lung field: Secondary | ICD-10-CM

## 2013-03-09 LAB — PROTEIN, TOTAL: Total Protein: 7.1 g/dL (ref 6.0–8.3)

## 2013-03-09 MED ORDER — IOHEXOL 350 MG/ML SOLN
80.0000 mL | Freq: Once | INTRAVENOUS | Status: AC | PRN
  Administered 2013-03-09: 80 mL via INTRAVENOUS

## 2013-03-09 NOTE — Telephone Encounter (Signed)
I discussed Ct scan results with his wife > shows a LUL mass and probable malignant effusion

## 2013-03-09 NOTE — Telephone Encounter (Signed)
Advised pt that once RB received CT results he would call and discuss those results.  Also, wanting to know can he have breakfast before the [facility] tomorrow?? Please advise, RB thanks!!!!

## 2013-03-10 ENCOUNTER — Encounter (HOSPITAL_COMMUNITY): Payer: Self-pay | Admitting: General Practice

## 2013-03-10 ENCOUNTER — Inpatient Hospital Stay (HOSPITAL_COMMUNITY)
Admission: AD | Admit: 2013-03-10 | Discharge: 2013-03-15 | DRG: 181 | Disposition: A | Discharge status: Home / Self Care | Payer: Medicare Other | Source: Ambulatory Visit | Attending: Pulmonary Disease | Admitting: Pulmonary Disease

## 2013-03-10 ENCOUNTER — Inpatient Hospital Stay (HOSPITAL_COMMUNITY): Payer: Medicare Other

## 2013-03-10 ENCOUNTER — Ambulatory Visit (HOSPITAL_COMMUNITY)
Admission: RE | Admit: 2013-03-10 | Discharge: 2013-03-10 | Disposition: A | Discharge status: Home / Self Care | Payer: Medicare Other | Source: Ambulatory Visit | Attending: Emergency Medicine | Admitting: Emergency Medicine

## 2013-03-10 DIAGNOSIS — I10 Essential (primary) hypertension: Secondary | ICD-10-CM

## 2013-03-10 DIAGNOSIS — J910 Malignant pleural effusion: Secondary | ICD-10-CM | POA: Diagnosis present

## 2013-03-10 DIAGNOSIS — R918 Other nonspecific abnormal finding of lung field: Secondary | ICD-10-CM

## 2013-03-10 DIAGNOSIS — E119 Type 2 diabetes mellitus without complications: Secondary | ICD-10-CM | POA: Diagnosis present

## 2013-03-10 DIAGNOSIS — J90 Pleural effusion, not elsewhere classified: Secondary | ICD-10-CM

## 2013-03-10 DIAGNOSIS — C3490 Malignant neoplasm of unspecified part of unspecified bronchus or lung: Secondary | ICD-10-CM | POA: Diagnosis present

## 2013-03-10 DIAGNOSIS — E785 Hyperlipidemia, unspecified: Secondary | ICD-10-CM | POA: Diagnosis present

## 2013-03-10 DIAGNOSIS — Z7982 Long term (current) use of aspirin: Secondary | ICD-10-CM

## 2013-03-10 DIAGNOSIS — M2550 Pain in unspecified joint: Secondary | ICD-10-CM

## 2013-03-10 DIAGNOSIS — Z79899 Other long term (current) drug therapy: Secondary | ICD-10-CM

## 2013-03-10 DIAGNOSIS — M109 Gout, unspecified: Secondary | ICD-10-CM

## 2013-03-10 DIAGNOSIS — Z87891 Personal history of nicotine dependence: Secondary | ICD-10-CM

## 2013-03-10 DIAGNOSIS — Z801 Family history of malignant neoplasm of trachea, bronchus and lung: Secondary | ICD-10-CM

## 2013-03-10 DIAGNOSIS — R197 Diarrhea, unspecified: Secondary | ICD-10-CM

## 2013-03-10 HISTORY — PX: CHEST TUBE INSERTION: SHX231

## 2013-03-10 HISTORY — PX: THORACENTESIS: SHX235

## 2013-03-10 HISTORY — DX: Pleural effusion, not elsewhere classified: J90

## 2013-03-10 HISTORY — DX: Type 2 diabetes mellitus without complications: E11.9

## 2013-03-10 MED ORDER — COLCHICINE 0.6 MG PO TABS
0.6000 mg | ORAL_TABLET | Freq: Two times a day (BID) | ORAL | Status: DC
  Administered 2013-03-11 – 2013-03-15 (×9): 0.6 mg via ORAL
  Filled 2013-03-10 (×15): qty 1

## 2013-03-10 MED ORDER — ALLOPURINOL 100 MG PO TABS
100.0000 mg | ORAL_TABLET | Freq: Every day | ORAL | Status: DC
  Administered 2013-03-11 – 2013-03-15 (×5): 100 mg via ORAL
  Filled 2013-03-10 (×6): qty 1

## 2013-03-10 MED ORDER — SODIUM CHLORIDE 0.9 % IJ SOLN: 3.0000 mL | Freq: Two times a day (BID) | INTRAMUSCULAR | Status: DC

## 2013-03-10 MED ORDER — OXYCODONE-ACETAMINOPHEN 5-325 MG PO TABS
1.0000 | ORAL_TABLET | ORAL | Status: DC | PRN
  Administered 2013-03-10 – 2013-03-15 (×15): 2 via ORAL
  Filled 2013-03-10 (×17): qty 2

## 2013-03-10 MED ORDER — AMLODIPINE BESYLATE 5 MG PO TABS
5.0000 mg | ORAL_TABLET | Freq: Every day | ORAL | Status: DC
  Administered 2013-03-11 – 2013-03-15 (×5): 5 mg via ORAL
  Filled 2013-03-10 (×6): qty 1

## 2013-03-10 MED ORDER — ASPIRIN EC 81 MG PO TBEC
81.0000 mg | DELAYED_RELEASE_TABLET | Freq: Every day | ORAL | Status: DC
  Administered 2013-03-11 – 2013-03-15 (×5): 81 mg via ORAL
  Filled 2013-03-10 (×6): qty 1

## 2013-03-10 MED ORDER — SODIUM CHLORIDE 0.9 % IJ SOLN: 3.0000 mL | INTRAMUSCULAR | Status: DC | PRN

## 2013-03-10 MED ORDER — LORAZEPAM 2 MG/ML IJ SOLN
1.0000 mg | Freq: Once | INTRAMUSCULAR | Status: AC
Start: 2013-03-10 — End: 2013-03-10
  Administered 2013-03-10: 1 mg via INTRAVENOUS
  Filled 2013-03-10: qty 1

## 2013-03-10 MED ORDER — LISINOPRIL 20 MG PO TABS
20.0000 mg | ORAL_TABLET | Freq: Every day | ORAL | Status: DC
  Administered 2013-03-11 – 2013-03-15 (×5): 20 mg via ORAL
  Filled 2013-03-10 (×6): qty 1

## 2013-03-10 MED ORDER — SODIUM CHLORIDE 0.9 % IV SOLN
250.0000 mL | INTRAVENOUS | Status: DC | PRN
  Administered 2013-03-10: 250 mL via INTRAVENOUS

## 2013-03-10 MED ORDER — ENOXAPARIN SODIUM 40 MG/0.4ML ~~LOC~~ SOLN
40.0000 mg | SUBCUTANEOUS | Status: DC
  Administered 2013-03-10 – 2013-03-13 (×4): 40 mg via SUBCUTANEOUS
  Filled 2013-03-10 (×6): qty 0.4

## 2013-03-10 NOTE — Progress Notes (Signed)
Chest tube was placed at bedside by MD. When this writer walked into pt's room a few minutes after the MD was done the [employer] system had already overflowed. The drainage system holds 2000 mL's. MD was notified. Another [place] was attached. MD asked to leave at water seal.

## 2013-03-10 NOTE — Procedures (Signed)
Wayne Catheter Chest Tube Placement  After informed consent explaining indications and risks of procedure, a Wayne catheter was placed using prepared kit. Insertion site was at the anterior axillary line and the catheter was directed posteriorly and slightly superiorly. After connection to Pleurevac collection chamber with suction, 1.5 liters of slightly bloody amber fluid was drained rapidly. There was a small amount of bubbling noted in the air leak indicator chamber. The catheter was then left on water seal  Pleural fluid sample was sent for cytology only   Merton Border, MD ; [facility] service Mobile [phone].  After 5:30 PM or weekends, call [phone]

## 2013-03-10 NOTE — H&P (Signed)
Name: Derrick Singleton MRN: [id number] DOB: 1945-09-06    ADMISSION DATE:  03/10/2013 CONSULTATION DATE:  03/10/13  REFERRING MD :  Linna Darner PRIMARY SERVICE: PCCM  CHIEF COMPLAINT:  Shortness of breath, DOE  BRIEF PATIENT DESCRIPTION: Mr. Derrick Singleton is a 68 y.o. male w/ PMHx of HTN, HLD, and gout, admitted for workup of extensive left-sided pleural effusion.  SIGNIFICANT EVENTS / STUDIES:  CT chest 3/5 >>> Massive malignant left pleural effusion, with pleural implants compatible with pleural carcinomatosis. Also w/ possible necrotic primary or metastatic lesion in the left upper lobe Wayne catheter 3/6 >>    LINES / TUBES: PIV  CULTURES: none  ANTIBIOTICS: none  HISTORY OF PRESENT ILLNESS:  Mr. [name] is a 68 y.o. male former smoker w/ PMHx of HTN, HLD, and Gout, referred to Pulmonology (Dr. Lamonte Sakai) for shortness of breath. An CXR was performed on 03/03/13 which showed a moderate to large left pleural effusion w/ additional infiltrate and LUL nodles as well. His SOB began about 2/22 along w/ some associated pleuritic chest pain, dry cough, chest tightness, and wheezing. He denies any hemoptysis, fever, chills, night sweats, nausea, or vomiting, but claims he has had a poor appetite for some time and says he has gained weight despite this.   PAST MEDICAL HISTORY :  Past Medical History  Diagnosis Date  . Gout   . Hyperlipemia   . Hypertension   . Low testosterone    Past Surgical History  Procedure Laterality Date  . Wisdom tooth extraction     Prior to Admission medications   Medication Sig Start Date End Date Taking? Authorizing Provider  allopurinol (ZYLOPRIM) 100 MG tablet Take 100 mg by mouth. 1/2 BY MOUTH X 2 MONTHS THEN INCREASE TO 1 BY MOUTH DAILY    Historical Provider, MD  amLODipine (NORVASC) 5 MG tablet TAKE ONE TABLET BY MOUTH ONE TIME DAILY 04/17/11   Hendricks Limes, MD  aspirin EC 81 MG tablet Take 81 mg by mouth daily.    Historical Provider, MD   atorvastatin (LIPITOR) 10 MG tablet  11/23/12   Historical Provider, MD  colchicine 0.6 MG tablet Take 0.6 mg by mouth 2 (two) times daily.    Historical Provider, MD  fenofibrate 160 MG tablet Take 160 mg by mouth daily.    Historical Provider, MD  fish oil-omega-3 fatty acids 1000 MG capsule Take 2 g by mouth. 2 by mouth in the am, 2 by mouth in the pm    Historical Provider, MD  glucose blood (ONE TOUCH ULTRA TEST) test strip Check blood sugar once daily 07/24/10   Hendricks Limes, MD  glucose blood test strip Test blood sugar daily. Dx Code: 250.00. OneTouch Verio IQ strips 12/13/12   Hendricks Limes, MD  indomethacin (INDOCIN) 50 MG capsule 1 q 8 hrs prn only with food 11/30/11   Hendricks Limes, MD  lisinopril (PRINIVIL,ZESTRIL) 20 MG tablet Take 20 mg by mouth daily.    Historical Provider, MD  metFORMIN (GLUCOPHAGE) 500 MG tablet Take by mouth 2 (two) times daily with a meal.    Historical Provider, MD  Jonetta Speak LANCETS MISC Check blood sugar once daily 07/24/10   Hendricks Limes, MD  traMADol (ULTRAM) 50 MG tablet Take 1 tablet (50 mg total) by mouth every 6 (six) hours as needed. 03/03/13   Hendricks Limes, MD  TURMERIC PO Take by mouth 2 (two) times daily.    Historical Provider, MD  No Known Allergies  FAMILY HISTORY:  Family History  Problem Relation Age of Onset  . Lung cancer Father     smoker  . Heart disease Neg Hx   . Stroke Neg Hx   . Diabetes Neg Hx    SOCIAL HISTORY:  reports that he quit smoking about 7 years ago. His smoking use included Cigarettes. He has a 15 pack-year smoking history. He does not have any smokeless tobacco history on file. He reports that he drinks alcohol. He reports that he does not use illicit drugs.  REVIEW OF SYSTEMS:   General: Positive for appetite change. Denies fever, chills, diaphoresis, and fatigue.  Respiratory: Denies SOB, DOE, cough, chest tightness, and wheezing.   Cardiovascular: Denies chest pain and palpitations.   Gastrointestinal: Denies nausea, vomiting, abdominal pain, diarrhea, constipation, blood in stool and abdominal distention.  Genitourinary: Denies dysuria, urgency, frequency, hematuria, and flank pain. Endocrine: Denies hot or cold intolerance, polyuria, and polydipsia. Musculoskeletal: Denies myalgias, back pain, joint swelling, arthralgias and gait problem.  Skin: Denies pallor, rash and wounds.  Neurological: Denies dizziness, seizures, syncope, weakness, lightheadedness, numbness and headaches.  Psychiatric/Behavioral: Denies mood changes, confusion, nervousness, sleep disturbance and agitation.  SUBJECTIVE:   VITAL SIGNS: Temp:  [98.5 F (36.9 C)] 98.5 F (36.9 C) (03/06 1310) Pulse Rate:  [121] 121 (03/06 1310) Resp:  [20] 20 (03/06 1310) BP: (140)/(100) 140/100 mmHg (03/06 1310) SpO2:  [99 %] 99 % (03/06 1310) Weight:  [180 lb (81.647 kg)] 180 lb (81.647 kg) (03/06 1310) HEMODYNAMICS:   INTAKE / OUTPUT: Intake/Output   None     PHYSICAL EXAMINATION: Filed Vitals:   03/10/13 1310  BP: 140/100  Pulse: 121  Temp: 98.5 F (36.9 C)  TempSrc: Oral  Resp: 20  Height: 5\' 8"  (1.727 m)  Weight: 180 lb (81.647 kg)  SpO2: 99%  General: Vital signs reviewed.  Patient is a well-developed and well-nourished, in no acute distress and cooperative with exam.  Head: Normocephalic and atraumatic. Eyes: PERRL, EOMI, conjunctivae normal, No scleral icterus.  Neck: Supple, trachea midline, normal ROM, No JVD, masses, thyromegaly, or carotid bruit present.  Cardiovascular: Tachycardic, regular rhythm, S1 normal, S2 normal, no murmurs, gallops, or rubs. Pulmonary/Chest: No breath sounds heard diffusely on the left, dull to percussion diffusely. Air entry clear on the right, no wheezes or crackles heard. Dyspneic on exam, w/ intermittent cough. Abdominal: Soft, non-tender, non-distended, BS +, no masses, organomegaly, or guarding present.  Musculoskeletal: No joint deformities, erythema,  or stiffness, ROM full and nontender. Extremities: No swelling or edema,  pulses symmetric and intact bilaterally. No cyanosis or clubbing. Neurological: A&O x3, Strength is normal and symmetric bilaterally, cranial nerve II-XII are grossly intact, no focal motor deficit, sensory intact to light touch bilaterally.  Skin: Warm, dry and intact. No rashes or erythema. Psychiatric: Normal mood and affect. speech and behavior is normal. Cognition and memory are normal.   LABS:  CBC No results found for this basename: WBC, HGB, HCT, PLT,  in the last 168 hours Coag's No results found for this basename: APTT, INR,  in the last 168 hours BMET  Recent Labs Lab 03/08/13 1704  NA 131*  K 4.2  CL 96  CO2 22  BUN 17  CREATININE 0.9  GLUCOSE 125*   Electrolytes  Recent Labs Lab 03/08/13 1704  CALCIUM 9.2   Sepsis Markers No results found for this basename: LATICACIDVEN, PROCALCITON, O2SATVEN,  in the last 168 hours ABG No results found for this  basename: PHART, PCO2ART, PO2ART,  in the last 168 hours Liver Enzymes No results found for this basename: AST, ALT, ALKPHOS, BILITOT, ALBUMIN,  in the last 168 hours Cardiac Enzymes No results found for this basename: TROPONINI, PROBNP,  in the last 168 hours Imaging Ct Angio Chest W/cm &/or Wo Cm  03/09/2013   CLINICAL DATA:  Acute onset short of breath. Left-sided chest pain for 1 week. Abnormal chest radiograph.  EXAM: CT ANGIOGRAPHY CHEST WITH CONTRAST  TECHNIQUE: Multidetector CT imaging of the chest was performed using the standard protocol during bolus administration of intravenous contrast. Multiplanar CT image reconstructions and MIPs were obtained to evaluate the vascular anatomy.  CONTRAST:  65mL OMNIPAQUE IOHEXOL 350 MG/ML SOLN  COMPARISON:  DG CHEST 2 VIEW dated 03/03/2013  FINDINGS: There is a massive left pleural effusion with rightward mediastinal shift. Enhancing soft tissue is present along the lateral left upper lobe pleural  surface, compatible with pleural carcinomatosis. The left lower lobe is compressed and the majority of the left upper lobe is compressed. There is focal fluid that appears to be within the lung parenchyma in the left upper lobe (image 22 series 4) measuring 23 mm x 26 mm. This may represent a primary neoplasm or metastatic lesion.  Hilar adenopathy poorly evaluated in the presence of a large effusion. AP window lymphadenopathy is present with 13 mm node (image 38 series 4). Subcarinal node appears within normal limits. No contralateral hilar adenopathy in the right lung. There is no airspace disease or right pleural effusion. Suboptimal evaluation of the right lower lobe due to respiratory motion artifact. Small focus of ground-glass attenuation in the right upper lobe is nonspecific and can be followed. Right paraseptal emphysema incidentally noted. No acute aortic abnormality. No pulmonary embolus identified. Incidental imaging the upper abdomen is grossly within normal limits. The massive left pleural effusion versus the left hemidiaphragm. Respiratory motion degrades evaluation of the upper abdomen is well. There is no axillary adenopathy. No definite pericardial effusion. Coronary artery atherosclerosis is present. If office based assessment of coronary risk factors has not been performed, it is now recommended. Central airways grossly patent.  No aggressive osseous lesions. Sclerotic focus in the T10 vertebra on the right probably represents benign hemangioma. Bilateral gynecomastia incidentally noted.  Review of the MIP images confirms the above findings.  IMPRESSION: 1. Massive malignant left pleural effusion, with pleural implants compatible with pleural carcinomatosis. Thoracentesis for cytology should be considered. AP window adenopathy. 2. Possible necrotic primary or metastatic lesion in the left upper lobe measuring 26 mm x 23 mm (image 22 series 4). 3. Compressive atelectasis of the left upper and  lower lobes and right upper mediastinal shift capacious pulmonary capacity.   Electronically Signed   By: Dereck Ligas M.D.   On: 03/09/2013 15:15   ASSESSMENT / PLAN: Mr. Sudais Banghart is a 68 y.o. male w/ PMHx of HTN, HLD, and Gout, admitted for workup of extensive left-sided pleural effusion.  Massive L Pleural Effusion- Patient referred to pulmonary by PCP for dyspnea, which has worsened recently. Mr. Abair is a former smoke, quit ~7 years ago, but previously smoked for ~30 years. Also has family h/o lung CA (father). CXR on 03/03/13 shows massive left-sided effusion. CT scan from 03/09/13 shows likely malignant massive pleural effusion w/ pleural involvement/thickening, likely 2/2 pleural carcinomatosis. Also showed a possible primary metastatic lesion in the LUL, measuring 2.6 cm x 2.3 cm. Significant compression atelectasis of LUL 2/2 massive effusion. Patient w/ significant dyspnea  on exam, obviously in some discomfort from left lung compression. Sent for outpatient thoracentesis today, however, patient would benefit more from inpatient admission and daily drainage of the effusion.  -Admit to med-surg -Thoracentesis to be performed at bedside/ Will insert catheter for daily drainage -Send fluid for cytology -CXR now + in AM -Ativan 1 mg once prior to procedure -Percocet 5-325 q4h prn for pain -CBC, CMP in AM -Supplemental O2 via [place] prn -Monitor I/O's -Continuous pulse oximetry for now  HTN- BP well controlled at this time. -Continue Norvasc 5 mg qd  Gout- No acute issues. Currently taking Allopurinol + Colchicine -Continue home meds  HLD- No acute issues. On Lipitor 10 mg MWF + Fenofibrate -Will hold home meds for now.   DVT PPx- Lovenox [place]  I have personally obtained a history, examined the patient, evaluated laboratory and imaging results, formulated the assessment and plan and placed orders. CRITICAL CARE: The patient is critically ill with multiple organ systems failure and  requires high complexity decision making for assessment and support, frequent evaluation and titration of therapies, application of advanced monitoring technologies and extensive interpretation of multiple databases. Critical Care Time devoted to patient care services described in this note is 35 minutes.   Signed: Luanne Bras, MD 03/10/2013 2:54 PM   PCCM ATTENDING: I have interviewed and examined the patient and reviewed the database. I have formulated the assessment and plan as reflected in the note above with amendments made by me.   This is almost certainly malignant. Hopefully pleural fluid cytology will be positive. Regardless, he might need VATS for definitive management of this massive effusion  Merton Border, MD;  PCCM service; Mobile [phone]

## 2013-03-11 ENCOUNTER — Inpatient Hospital Stay (HOSPITAL_COMMUNITY): Payer: Medicare Other

## 2013-03-11 DIAGNOSIS — I10 Essential (primary) hypertension: Secondary | ICD-10-CM

## 2013-03-11 LAB — COMPREHENSIVE METABOLIC PANEL
ALBUMIN: 2.5 g/dL — AB (ref 3.5–5.2)
ALT: 45 U/L (ref 0–53)
AST: 33 U/L (ref 0–37)
Alkaline Phosphatase: 51 U/L (ref 39–117)
BUN: 15 mg/dL (ref 6–23)
CO2: 21 mEq/L (ref 19–32)
CREATININE: 0.85 mg/dL (ref 0.50–1.35)
Calcium: 9 mg/dL (ref 8.4–10.5)
Chloride: 101 mEq/L (ref 96–112)
GFR calc Af Amer: >90 mL/min (ref >90)
GFR calc non Af Amer: 89 mL/min — ABNORMAL LOW (ref >90)
Glucose, Bld: 105 mg/dL — ABNORMAL HIGH (ref 70–99)
POTASSIUM: 4.4 meq/L (ref 3.7–5.3)
Sodium: 136 mEq/L — ABNORMAL LOW (ref 137–147)
TOTAL PROTEIN: 6.4 g/dL (ref 6.0–8.3)
Total Bilirubin: 0.2 mg/dL — ABNORMAL LOW (ref 0.3–1.2)

## 2013-03-11 LAB — CBC
HCT: 37.3 % — ABNORMAL LOW (ref 39.0–52.0)
Hemoglobin: 12.8 g/dL — ABNORMAL LOW (ref 13.0–17.0)
MCH: 28.9 pg (ref 26.0–34.0)
MCHC: 34.3 g/dL (ref 30.0–36.0)
MCV: 84.2 fL (ref 78.0–100.0)
Platelets: 489 10*3/uL — ABNORMAL HIGH (ref 150–400)
RBC: 4.43 MIL/uL (ref 4.22–5.81)
RDW: 12.7 % (ref 11.5–15.5)
WBC: 7 10*3/uL (ref 4.0–10.5)

## 2013-03-11 LAB — PROTIME-INR
INR: 1.05 (ref 0.00–1.49)
PROTHROMBIN TIME: 13.5 s (ref 11.6–15.2)

## 2013-03-11 NOTE — Progress Notes (Signed)
Name: Derrick Singleton MRN: [id number] DOB: 06-07-1945    ADMISSION DATE:  03/10/2013 CONSULTATION DATE:  03/10/13  REFERRING MD :  Linna Darner PRIMARY SERVICE: PCCM  CHIEF COMPLAINT:  Shortness of breath, DOE  BRIEF PATIENT DESCRIPTION: Mr. Derrick Singleton is a 68 y.o. male  Quit smoking 7 y pta w/ PMHx of HTN, HLD, and gout, admitted for workup of extensive left-sided pleural effusion.  SIGNIFICANT EVENTS / STUDIES:  CT chest 3/5 >>> Massive malignant left pleural effusion, with pleural implants compatible with pleural carcinomatosis. Also w/ possible necrotic primary or metastatic lesion in the left upper lobe Wayne catheter 3/6 > 2 liters bloody fluid >>>   LINES / TUBES: L chest tube 3/6 >>>  CULTURES: none  ANTIBIOTICS: None   SUBJECTIVE:  Comfortable in chair/ RA, chest tube pain controlled   VITAL SIGNS: Temp:  [98.4 F (36.9 C)-98.9 F (37.2 C)] 98.4 F (36.9 C) (03/07 0624) Pulse Rate:  [97-121] 97 (03/07 0624) Resp:  [16-20] 18 (03/07 0624) BP: (112-140)/(74-100) 112/76 mmHg (03/07 0624) SpO2:  [92 %-99 %] 94 % (03/07 0624) Weight:  [180 lb (81.647 kg)] 180 lb (81.647 kg) (03/06 1310) HEMODYNAMICS:   INTAKE / OUTPUT: Intake/Output     03/06 0701 - 03/07 0700 03/07 0701 - 03/08 0700   P.O. 1600 360   Total Intake(mL/kg) 1600 (19.6) 360 (4.4)   Urine (mL/kg/hr) 1800    Chest Tube 2400 300 (1.2)   Total Output 4200 300   Net -2600 +60          PHYSICAL EXAMINATION: Filed Vitals:   03/10/13 1310 03/10/13 2126 03/11/13 0210 03/11/13 0624  BP: 140/100 113/74 129/86 112/76  Pulse: 121 102 104 97  Temp: 98.5 F (36.9 C) 98.9 F (37.2 C) 98.6 F (37 C) 98.4 F (36.9 C)  TempSrc: Oral Oral Oral Oral  Resp: 20 19 16 18   Height: 5\' 8"  (1.727 m)     Weight: 180 lb (81.647 kg)     SpO2: 99% 92% 94% 94%  General: Vital signs reviewed.  Patient is a well-developed and well-nourished, in no acute distress and cooperative with exam.  Head: Normocephalic and  atraumatic. Eyes: PERRL, EOMI, conjunctivae normal, No scleral icterus.  Neck: Supple, trachea midline, normal ROM, No JVD, masses, thyromegaly, or carotid bruit present.  Cardiovascular: Tachycardic, regular rhythm, S1 normal, S2 normal, no murmurs, gallops, or rubs. Pulmonary/Chest: decreased bs on L with a few insp crackles Abdominal: Soft, non-tender, non-distended, BS +, no masses, organomegaly, or guarding present.  Musculoskeletal: No joint deformities, erythema, or stiffness, ROM full and nontender. Extremities: No swelling or edema,  pulses symmetric and intact bilaterally. No cyanosis or clubbing. Neurological: A&O x3, Strength is normal and symmetric bilaterally, cranial nerve II-XII are grossly intact, no focal motor deficit, sensory intact to light touch bilaterally.  Skin: Warm, dry and intact. No rashes or erythema. Psychiatric: Normal mood and affect. speech and behavior is normal. Cognition and memory are normal.   LABS:  CBC  Recent Labs Lab 03/11/13 0405  WBC 7.0  HGB 12.8*  HCT 37.3*  PLT 489*   Coag's  Recent Labs Lab 03/11/13 0405  INR 1.05   BMET  Recent Labs Lab 03/08/13 1704 03/11/13 0405  NA 131* 136*  K 4.2 4.4  CL 96 101  CO2 22 21  BUN 17 15  CREATININE 0.9 0.85  GLUCOSE 125* 105*   Electrolytes  Recent Labs Lab 03/08/13 1704 03/11/13 0405  CALCIUM 9.2 9.0  Sepsis Markers No results found for this basename: LATICACIDVEN, PROCALCITON, O2SATVEN,  in the last 168 hours ABG No results found for this basename: PHART, PCO2ART, PO2ART,  in the last 168 hours Liver Enzymes  Recent Labs Lab 03/11/13 0405  AST 33  ALT 45  ALKPHOS 51  BILITOT 0.2*  ALBUMIN 2.5*   Cardiac Enzymes No results found for this basename: TROPONINI, PROBNP,  in the last 168 hours  pcxr 3/7 1. Pleural thickening on the left likely represent some residual  pleural fluid and perhaps pleural tumor. Improved aeration in the  left lung.  2. Mild  cardiomegaly.  3. AP window adenopathy   ASSESSMENT / PLAN: Mr. Derrick Singleton is a 68 y.o. male w/ PMHx of HTN, HLD, and Gout, admitted for workup of extensive left-sided pleural effusion.  Massive L Pleural Effusion-  S/p L CT 3/6 with underlying L sided cxr findings suspicious for lung ca rec  Await cyt  HTN- BP well controlled at this time. -Continue Norvasc 5 mg qd  Gout- No acute issues. Currently taking Allopurinol + Colchicine -Continue home meds  HLD- No acute issues. On Lipitor 10 mg MWF + Fenofibrate -Will hold home meds for now.   DVT PPx- Lovenox [place]    Christinia Gully, MD Pulmonary and [place] [phone] After 5:30 PM or weekends, call [phone]

## 2013-03-12 ENCOUNTER — Inpatient Hospital Stay (HOSPITAL_COMMUNITY): Payer: Medicare Other

## 2013-03-12 NOTE — Progress Notes (Signed)
Name: Harish Bram MRN: [id number] DOB: [birth year]    ADMISSION DATE:  03/10/2013 CONSULTATION DATE:  03/10/13  REFERRING MD :  Linna Darner PRIMARY SERVICE: PCCM  CHIEF COMPLAINT:  Shortness of breath, DOE  BRIEF PATIENT DESCRIPTION: Mr. Jakaiden Fill is a 68 y.o. male  Quit smoking 7 y pta w/ PMHx of HTN, HLD, and gout, admitted for workup of extensive left-sided pleural effusion.  SIGNIFICANT EVENTS / STUDIES:  CT chest 3/5 >>> Massive malignant left pleural effusion, with pleural implants compatible with pleural carcinomatosis. Also w/ possible necrotic primary or metastatic lesion in the left upper lobe Wayne catheter 3/6 > 2 liters bloody fluid > exudative by protein, cytology>>>   LINES / TUBES: L chest tube 3/6 >>>  CULTURES: none  ANTIBIOTICS: None   SUBJECTIVE:  Lying flat on RA , chest tube pain controlled   VITAL SIGNS: Temp:  [98.4 F (36.9 C)-98.9 F (37.2 C)] 98.6 F (37 C) (03/08 0954) Pulse Rate:  [100-115] 103 (03/08 0954) Resp:  [18-19] 18 (03/08 0954) BP: (106-122)/(70-79) 122/76 mmHg (03/08 0954) SpO2:  [93 %-99 %] 93 % (03/08 0954) HEMODYNAMICS:   INTAKE / OUTPUT: Intake/Output     03/07 0701 - 03/08 0700 03/08 0701 - 03/09 0700   P.O. 1420 360   Total Intake(mL/kg) 1420 (17.4) 360 (4.4)   Urine (mL/kg/hr) 1475 (0.8)    Chest Tube 700 (0.4)    Total Output 2175     Net -755 +360          PHYSICAL EXAMINATION: Filed Vitals:   03/11/13 1835 03/11/13 2114 03/12/13 0616 03/12/13 0954  BP: 120/77 119/75 118/79 122/76  Pulse: 106 115 100 103  Temp: 98.5 F (36.9 C) 98.9 F (37.2 C) 98.6 F (37 C) 98.6 F (37 C)  TempSrc: Oral Oral Oral Oral  Resp: 18 18 19 18   Height:      Weight:      SpO2: 96% 97% 95% 93%  General: Vital signs reviewed.  Patient is a well-developed and well-nourished, in no acute distress and cooperative with exam.  Head: Normocephalic and atraumatic. Eyes: PERRL, EOMI, conjunctivae normal, No scleral icterus.  Neck:  Supple, trachea midline, normal ROM, No JVD, masses, thyromegaly, or carotid bruit present.  Cardiovascular: Tachycardic, regular rhythm, S1 normal, S2 normal, no murmurs, gallops, or rubs. Pulmonary/Chest: decreased bs on L with a few insp crackles Abdominal: Soft, non-tender, non-distended, BS +, no masses, organomegaly, or guarding present.  Musculoskeletal: No joint deformities, erythema, or stiffness, ROM full and nontender. Extremities: No swelling or edema,  pulses symmetric and intact bilaterally. No cyanosis or clubbing. Neurological: A&O x3, Strength is normal and symmetric bilaterally, cranial nerve II-XII are grossly intact, no focal motor deficit, sensory intact to light touch bilaterally.  Skin: Warm, dry and intact. No rashes or erythema. Psychiatric: Normal mood and affect. speech and behavior is normal. Cognition and memory are normal.   LABS:  CBC  Recent Labs Lab 03/11/13 0405  WBC 7.0  HGB 12.8*  HCT 37.3*  PLT 489*   Coag's  Recent Labs Lab 03/11/13 0405  INR 1.05   BMET  Recent Labs Lab 03/08/13 1704 03/11/13 0405  NA 131* 136*  K 4.2 4.4  CL 96 101  CO2 22 21  BUN 17 15  CREATININE 0.9 0.85  GLUCOSE 125* 105*   Electrolytes  Recent Labs Lab 03/08/13 1704 03/11/13 0405  CALCIUM 9.2 9.0   Sepsis Markers No results found for this basename: LATICACIDVEN, PROCALCITON,  O2SATVEN,  in the last 168 hours ABG No results found for this basename: PHART, PCO2ART, PO2ART,  in the last 168 hours Liver Enzymes  Recent Labs Lab 03/11/13 0405  AST 33  ALT 45  ALKPHOS 51  BILITOT 0.2*  ALBUMIN 2.5*   Cardiac Enzymes No results found for this basename: TROPONINI, PROBNP,  in the last 168 hours  pcxr 3/8 Improved aeration L  ASSESSMENT / PLAN: Mr. Florian Chauca is a 68 y.o. male w/ PMHx of HTN, HLD, and Gout, admitted for workup of extensive left-sided pleural effusion.  Massive L Pleural Effusion-  S/p L CT 3/6 with underlying L sided cxr  findings suspicious for lung ca - still putting out 700 cc last 24h rec  Await cyt  HTN- BP well controlled at this time. -Continue Norvasc 5 mg qd  Gout- No acute issues. Currently taking Allopurinol + Colchicine -Continue home meds  HLD- No acute issues. On Lipitor 10 mg MWF + Fenofibrate -Will hold home meds for now.   DVT PPx- Lovenox [place], tol well     Christinia Gully, MD [facility] [place] [phone] After 5:30 PM or weekends, call [phone]

## 2013-03-13 ENCOUNTER — Inpatient Hospital Stay (HOSPITAL_COMMUNITY): Payer: Medicare Other

## 2013-03-13 NOTE — Progress Notes (Signed)
Name: Derrick Singleton MRN: [id number] DOB: 1945-12-19    ADMISSION DATE:  03/10/2013 CONSULTATION DATE:  03/10/13  REFERRING MD :  Linna Darner PRIMARY SERVICE: PCCM  CHIEF COMPLAINT:  Shortness of breath, DOE  BRIEF PATIENT DESCRIPTION: Mr. Derrick Singleton is a 68 y.o. male  Quit smoking 7 y pta w/ PMHx of HTN, HLD, and gout, admitted for workup of extensive left-sided pleural effusion.  SIGNIFICANT EVENTS / STUDIES:  CT chest 3/5 >>> Massive malignant left pleural effusion, with pleural implants compatible with pleural carcinomatosis. Also w/ possible necrotic primary or metastatic lesion in the left upper lobe Derrick Singleton 3/6 > 2 liters bloody fluid > exudative by protein, cytology>>>  LINES / TUBES: L chest tube 3/6 >>>  CULTURES: none  ANTIBIOTICS: None  SUBJECTIVE:  Lying flat on RA , chest tube pain controlled   VITAL SIGNS: Temp:  [98.2 F (36.8 C)-98.6 F (37 C)] 98.4 F (36.9 C) (03/09 1015) Pulse Rate:  [98-102] 99 (03/09 1015) Resp:  [16-18] 18 (03/09 1015) BP: (104-135)/(68-86) 107/73 mmHg (03/09 1015) SpO2:  [94 %-98 %] 95 % (03/09 1015) HEMODYNAMICS:   INTAKE / OUTPUT: Intake/Output     03/08 0701 - 03/09 0700 03/09 0701 - 03/10 0700   P.O. 1660    Total Intake(mL/kg) 1660 (20.3)    Urine (mL/kg/hr) 1101 (0.6) 600 (1.4)   Chest Tube 375 (0.2)    Total Output 1476 600   Net +184 -600        Stool Occurrence 1 x      PHYSICAL EXAMINATION: Filed Vitals:   03/12/13 2213 03/13/13 0120 03/13/13 0536 03/13/13 1015  BP: 104/68 126/76 135/86 107/73  Pulse: 102 98 101 99  Temp: 98.2 F (36.8 C) 98.4 F (36.9 C) 98.6 F (37 C) 98.4 F (36.9 C)  TempSrc: Oral Oral Oral Oral  Resp: 17 17 16 18   Height:      Weight:      SpO2: 96% 98% 98% 95%  General: Vital signs reviewed.  Patient is a well-developed and well-nourished, in no acute distress and cooperative with exam.  Head: Normocephalic and atraumatic. Eyes: PERRL, EOMI, conjunctivae normal, No scleral  icterus.  Neck: Supple, trachea midline, normal ROM, No JVD, masses, thyromegaly, or carotid bruit present.  Cardiovascular: Tachycardic, regular rhythm, S1 normal, S2 normal, no murmurs, gallops, or rubs. Pulmonary/Chest: decreased bs on L with a few insp crackles Abdominal: Soft, non-tender, non-distended, BS +, no masses, organomegaly, or guarding present.  Musculoskeletal: No joint deformities, erythema, or stiffness, ROM full and nontender. Extremities: No swelling or edema,  pulses symmetric and intact bilaterally. No cyanosis or clubbing. Neurological: A&O x3, Strength is normal and symmetric bilaterally, cranial nerve II-XII are grossly intact, no focal motor deficit, sensory intact to light touch bilaterally.  Skin: Warm, dry and intact. No rashes or erythema. Psychiatric: Normal mood and affect. speech and behavior is normal. Cognition and memory are normal.   LABS:  CBC  Recent Labs Lab 03/11/13 0405  WBC 7.0  HGB 12.8*  HCT 37.3*  PLT 489*   Coag's  Recent Labs Lab 03/11/13 0405  INR 1.05   BMET  Recent Labs Lab 03/08/13 1704 03/11/13 0405  NA 131* 136*  K 4.2 4.4  CL 96 101  CO2 22 21  BUN 17 15  CREATININE 0.9 0.85  GLUCOSE 125* 105*   Electrolytes  Recent Labs Lab 03/08/13 1704 03/11/13 0405  CALCIUM 9.2 9.0   CXR 3/9 >> Stable residual L atx  and effusion with chest tube and no PTX  ASSESSMENT / PLAN: Mr. Derrick Singleton is a 68 y.o. male w/ PMHx of HTN, HLD, and Gout, admitted for workup of extensive left-sided pleural effusion.  Massive L Pleural Effusion-  S/p L CT 3/6 with underlying L sided cxr findings consistent with primary lung CA and malignant effusion; output high, 1400 last 24 hours - await cytology results. If revealing then will involve oncology. If negative for malignancy will re-send more fluid for cytology and also involve TCTS for possible VATS pleural bx  HTN- BP well controlled at this time. -Continue Norvasc 5 mg  qd  Gout- No acute issues. Currently taking Allopurinol + Colchicine -Continue home meds  HLD- No acute issues. On Lipitor 10 mg MWF + Fenofibrate -Will hold home meds for now.   DVT PPx - Lovenox Derrick Singleton, tol well     Baltazar Apo, MD, PhD 03/13/2013, 12:22 PM [place] [facility] [phone] or if no answer [phone]

## 2013-03-14 ENCOUNTER — Inpatient Hospital Stay (HOSPITAL_COMMUNITY): Payer: Medicare Other

## 2013-03-14 ENCOUNTER — Encounter: Payer: Self-pay | Admitting: *Deleted

## 2013-03-14 MED ORDER — CEFAZOLIN (ANCEF) 1 G IV SOLR: 1.0000 g | INTRAVENOUS | Status: DC

## 2013-03-14 MED ORDER — MIDAZOLAM HCL 2 MG/2ML IJ SOLN
INTRAMUSCULAR | Status: AC | PRN
  Administered 2013-03-14 (×3): 1 mg via INTRAVENOUS

## 2013-03-14 MED ORDER — FENTANYL CITRATE 0.05 MG/ML IJ SOLN
INTRAMUSCULAR | Status: AC
  Filled 2013-03-14: qty 2

## 2013-03-14 MED ORDER — CEFAZOLIN (ANCEF) 1 G IV SOLR
2.0000 g | Freq: Once | INTRAVENOUS | Status: AC
  Administered 2013-03-14: 2 g

## 2013-03-14 MED ORDER — MIDAZOLAM HCL 2 MG/2ML IJ SOLN
INTRAMUSCULAR | Status: AC
  Filled 2013-03-14: qty 4

## 2013-03-14 MED ORDER — CEFAZOLIN SODIUM-DEXTROSE 2-3 GM-% IV SOLR
INTRAVENOUS | Status: AC
  Filled 2013-03-14: qty 50

## 2013-03-14 MED ORDER — FENTANYL CITRATE 0.05 MG/ML IJ SOLN
INTRAMUSCULAR | Status: AC | PRN
  Administered 2013-03-14 (×2): 50 ug via INTRAVENOUS

## 2013-03-14 NOTE — Progress Notes (Signed)
Name: [name] MRN: [id number] DOB: [birth year]    ADMISSION DATE:  03/10/2013 CONSULTATION DATE:  03/10/13  REFERRING MD :  Linna Darner PRIMARY SERVICE: PCCM  CHIEF COMPLAINT:  Shortness of breath, DOE  BRIEF PATIENT DESCRIPTION: Mr. Derrick Singleton is a 68 y.o. male  Quit smoking 7 y pta w/ PMHx of HTN, HLD, and gout, admitted for workup of extensive left-sided pleural effusion.  SIGNIFICANT EVENTS / STUDIES:  CT chest 3/5 >>> Massive malignant left pleural effusion, with pleural implants compatible with pleural carcinomatosis. Also w/ possible necrotic primary or metastatic lesion in the left upper lobe Derrick Singleton 3/6 > 2 liters bloody fluid > exudative by protein, cytology >>> PRELIMINARY results concerning for malignant cells consistent with metastatic adenocarcinoma (3/10 - final read expected within 24 - 48 hours).  LINES / TUBES: L chest tube 3/6 >>>  CULTURES: none  ANTIBIOTICS: None  SUBJECTIVE:  No acute events.  Pain well controlled.  Breathing improved, denies any SOB, chest pain.    VITAL SIGNS: Temp:  [98.1 F (36.7 C)-98.6 F (37 C)] 98.6 F (37 C) (03/10 0533) Pulse Rate:  [94-100] 94 (03/10 0533) Resp:  [16-18] 16 (03/10 0533) BP: (102-123)/(73-85) 123/85 mmHg (03/10 0533) SpO2:  [95 %-99 %] 96 % (03/10 0533) HEMODYNAMICS:   INTAKE / OUTPUT: Intake/Output     03/09 0701 - 03/10 0700 03/10 0701 - 03/11 0700   P.O. 840    Total Intake(mL/kg) 840 (10.3)    Urine (mL/kg/hr) 2450 (1.3)    Chest Tube 472 (0.2)    Total Output 2922     Net -2082            PHYSICAL EXAMINATION: Filed Vitals:   03/13/13 1344 03/13/13 1845 03/13/13 2118 03/14/13 0533  BP: 108/74  102/73 123/85  Pulse: 100  100 94  Temp: 98.1 F (36.7 C)  98.1 F (36.7 C) 98.6 F (37 C)  TempSrc: Oral  Oral Oral  Resp: 16  16 16   Height:      Weight:      SpO2: 99% 98% 98% 96%    General: Pleasant male, sitting up in bed, in NAD. Neuro: A&O x 3, non-focal.  HEENT: [place]/AT.  PERRL, sclerae anicteric. Cardiovascular: RRR, no M/R/G.  Lungs: Respirations even and unlabored.  CTA bilaterally although slightly diminished on left, No W/R/R.  Derrick Singleton in place L chest, site C/D/I. Abdomen: BS x 4, soft, NT/ND.  Musculoskeletal: No gross deformities, no edema.  Skin: Intact, warm, no rashes.   LABS:  CBC  Recent Labs Lab 03/11/13 0405  WBC 7.0  HGB 12.8*  HCT 37.3*  PLT 489*   Coag's  Recent Labs Lab 03/11/13 0405  INR 1.05   BMET  Recent Labs Lab 03/08/13 1704 03/11/13 0405  NA 131* 136*  K 4.2 4.4  CL 96 101  CO2 22 21  BUN 17 15  CREATININE 0.9 0.85  GLUCOSE 125* 105*   Electrolytes  Recent Labs Lab 03/08/13 1704 03/11/13 0405  CALCIUM 9.2 9.0   CXR 3/9 >> Stable residual L atx and effusion with chest tube and no PTX  ASSESSMENT / PLAN: Mr. Derrick Singleton is a 68 y.o. male w/ PMHx of HTN, HLD, and Gout, admitted for workup of extensive left-sided pleural effusion.  Massive L Pleural Effusion-  S/p L CT 3/6 with underlying L sided cxr findings consistent with primary lung CA and malignant effusion; output high, 472 last 24 hours - Awaiting final read on cytology  results. Spoke with pathology on 3/10 and preliminary read is concerning for malignant cells consistent with metastatic adenocarcinoma. -  He will need quick f/u w Dr Julien Nordmann as an outpt - Will try and arrange for [facility] Singleton chest tube to be exchanged for a PleurX this PM so that pt could possibly be discharged home. - If final read negative for malignancy, will re-send more fluid for cytology and also involve TCTS for possible VATS pleural bx.  HTN- BP well controlled at this time. -Continue Norvasc 5 mg qd  Gout- No acute issues. Currently taking Allopurinol + Colchicine -Continue home meds  HLD- No acute issues. On Lipitor 10 mg MWF + Fenofibrate -Will hold home meds for now, restart at d/c  DVT PPx - Lovenox [place], tol well    Derrick Hora, PA -  [facility] [place] [facility] Pgr: [phone]  or [phone]  Baltazar Apo, MD, PhD 03/14/2013, 11:14 AM [place] [facility] [phone] or if no answer [phone]

## 2013-03-14 NOTE — H&P (Signed)
Derrick Singleton is an 68 y.o. male.   Chief Complaint: pt admitted to hospital with shortness of breath and dyspnea Was admitted to CCM and was found to have massive L pleural effusion 2 Liters of bloody fluid was removed and CCM phys placed a Wayne catheter into  effusion for continual access for drainage- placed 03/10/13. Effusion has been tested with cells compatible with pleural carcinomatosis. Final path prob 3/12. Susp for adenocarcinoma Pt to be discharged home today or tomorrow. CCM has requested drain catheter exchange to tunneled PleurX cath for home use. Pt to follow with Dr Earlie Server after dc.   HPI: HLD; HTN;  malig L pleural effusion- recurrent; DM  Past Medical History  Diagnosis Date  . Gout   . Hyperlipemia   . Hypertension   . Low testosterone   . Pleural effusion 03/10/2013    left lung            DR SIMONDS  . Diabetes mellitus without complication     TYPE 2 NO MEDICATIONS AT THIS TIME     Past Surgical History  Procedure Laterality Date  . Wisdom tooth extraction    . Chest tube insertion  03/10/2013  . Thoracentesis  03/10/2013    Family History  Problem Relation Age of Onset  . Lung cancer Father     smoker  . Heart disease Neg Hx   . Stroke Neg Hx   . Diabetes Neg Hx    Social History:  reports that he quit smoking about 7 years ago. His smoking use included Cigarettes. He has a 15 pack-year smoking history. He has never used smokeless tobacco. He reports that he drinks alcohol. He reports that he does not use illicit drugs.  Allergies: No Known Allergies  Medications Prior to Admission  Medication Sig Dispense Refill  . allopurinol (ZYLOPRIM) 100 MG tablet Take 100 mg by mouth daily.      Marland Kitchen amLODipine (NORVASC) 5 MG tablet Take 5 mg by mouth daily.      Marland Kitchen aspirin EC 81 MG tablet Take 81 mg by mouth every morning.       Marland Kitchen atorvastatin (LIPITOR) 10 MG tablet Take 10 mg by mouth 3 (three) times a week. Monday, Wednesday, and friday      . colchicine  0.6 MG tablet Take 0.6 mg by mouth 2 (two) times daily.      . fenofibrate 160 MG tablet Take 160 mg by mouth daily.      . fish oil-omega-3 fatty acids 1000 MG capsule Take 2 g by mouth. 2 by mouth in the am, 2 by mouth in the pm      . lisinopril (PRINIVIL,ZESTRIL) 20 MG tablet Take 20 mg by mouth daily.      . metFORMIN (GLUCOPHAGE) 500 MG tablet Take 500 mg by mouth 2 (two) times daily with a meal.       . traMADol (ULTRAM) 50 MG tablet Take 50 mg by mouth every 6 (six) hours as needed for moderate pain.      . TURMERIC PO Take 1 tablet by mouth 2 (two) times daily.         No results found for this or any previous visit (from the past 48 hour(s)). Dg Chest Port 1 View  03/13/2013   CLINICAL DATA:  Effusion  EXAM: PORTABLE CHEST - 1 VIEW  COMPARISON:  the previous day's study  FINDINGS: Pigtail catheter projects over the left lung base as before. The pneumothorax suspected previously  is no longer evident. There is left lateral pleural thickening or loculated effusion. Coarse interstitial and airspace opacities throughout the left lung worse at the base. Right lung remains clear. Heart size upper limits normal. .  Visualized skeletal structures are unremarkable.  IMPRESSION: Stable left chest tube with no pneumothorax.  Persistent left lateral pleural thickening or loculated effusion.   Electronically Signed   By: Arne Cleveland M.D.   On: 03/13/2013 08:21    Review of Systems  Constitutional: Positive for weight loss and malaise/fatigue. Negative for fever.  Respiratory: Positive for cough, sputum production, shortness of breath and wheezing.   Cardiovascular: Positive for chest pain.  Gastrointestinal: Negative for nausea, vomiting and abdominal pain.  Musculoskeletal: Negative for back pain.  Neurological: Negative for dizziness and headaches.  Psychiatric/Behavioral: Negative for substance abuse.       Quit smoking 7 yrs ago    Blood pressure 123/85, pulse 94, temperature 98.6 F (37  C), temperature source Oral, resp. rate 16, height 5\' 8"  (1.727 m), weight 81.647 kg (180 lb), SpO2 96.00%. Physical Exam  Constitutional: He is oriented to person, place, and time. He appears well-developed and well-nourished.  Cardiovascular: Normal rate.   No murmur heard. Respiratory: Effort normal. He has wheezes.  GI: Soft. Bowel sounds are normal. There is no tenderness.  Neurological: He is alert and oriented to person, place, and time.  Skin: Skin is warm and dry.  Psychiatric: He has a normal mood and affect. His behavior is normal. Judgment and thought content normal.     Assessment/Plan L massive recurrent pleural effusion Most likely malignant Wayne cath in now per CCM Request for exchange to PleurX tunneled cath placement today. Poss dc home today or tomorrow. Pt and family aware of procedure benefits and risks and agreeable to proceed Consent signed and in chart  [place] A 03/14/2013, 12:06 PM

## 2013-03-14 NOTE — Progress Notes (Signed)
Received referral from Dr. Jana Hakim today.  I will set pt up to see Dr. Julien Nordmann after discharged from hospital.

## 2013-03-14 NOTE — Procedures (Signed)
Successful conversion of the left pleural pigtail drain for a tunneled pleural drain ([place]).  No immediate complication.

## 2013-03-15 ENCOUNTER — Encounter: Payer: Self-pay | Admitting: Emergency Medicine

## 2013-03-15 ENCOUNTER — Encounter: Payer: Self-pay | Admitting: *Deleted

## 2013-03-15 ENCOUNTER — Telehealth: Payer: Self-pay | Admitting: *Deleted

## 2013-03-15 ENCOUNTER — Encounter: Payer: Self-pay | Admitting: Internal Medicine

## 2013-03-15 MED ORDER — OXYCODONE-ACETAMINOPHEN 5-325 MG PO TABS: 1.0000 | ORAL_TABLET | ORAL | Status: DC | PRN

## 2013-03-15 NOTE — [facility] Oncology Navigator Note (Unsigned)
Dr. Agustina Caroli PA called requesting an appt for pt.  I gave appt.  He will notify pt.

## 2013-03-15 NOTE — Telephone Encounter (Signed)
Please advise RB thanks 

## 2013-03-15 NOTE — Discharge Summary (Signed)
Physician Discharge Summary  Patient ID: Derrick Singleton MRN: [id number] DOB/AGE: 03-19-1945 68 y.o.  Admit date: 03/10/2013 Discharge date: 03/15/2013    Discharge Diagnoses:  Massive Left Pleural Effusion - CXR and Chest CT findings concerning for malignant effusion with high output. HTN HLD DM Gout                                                                     DISCHARGE PLAN BY DIAGNOSIS     Massive Left Pleural Effusion - CXR and Chest CT findings concerning for malignant effusion with high output. Plan: - Final cytology read pending. - Please follow up with Dr. Julien Nordmann on 03/22/13 as scheduled. - F/u with Dr. Lamonte Sakai on 03/22/13 as scheduled.  Hypertension Hyperlipidemia Plan: - Continue home meds. - Follow up with PCP as needed.  DM Plan: - Continue home meds. - Follow up with PCP as needed.  Gout Plan: - Continue home meds. - Follow up with PCP as needed.                 DISCHARGE SUMMARY   Derrick Singleton is a 68 y.o. y/o male with a PMH of DM, HLD, HTN, Gout, former smoker (quit 7 years ago), admitted on 03/10/13 for workup of extensive left sided pleural effusion, found on CXR obtained by his PCP on 03/03/13 after pt was experiencing dyspnea.  One day prior to admit (03/09/13), a CT chest was ordered which revealed a massive left sided pleural effusion concerning for malignancy. On day of admit, a wayne catheter was inserted into the left pleural space and 2 liters of bloody fluid was immediately drained.  Fluid was sent for routine labs as well as cytology. On 3/10, preliminary cytology results were concerning for malignant cells consistent with metastatic carcinoma, final read pending. That same day, IR was consulted to exchange the Bassett for a tunnelled PleurX so that [name] could be discharged home while final cytology read is pending and for follow up with Dr. Julien Nordmann as an outpatient. On 3/11, pt was deemed safe for discharge.   SIGNIFICANT  DIAGNOSTIC STUDIES & EVENTS CT Chest 3/5 >>> Massive malignant left pleural effusion, with pleural implants compatible with pleural carcinomatosis. Also w/ possible necrotic primary or metastatic lesion in the left upper lobe  Wayne Catheter 3/6 >>> 2 liters bloody fluid > exudative by protein, cytology >>> PRELIMINARY results concerning for malignant cells consistent with metastatic adenocarcinoma (3/10 - final read expected within 24 - 48 hours). PleurX Cathether Exchanged for [facility] 3/10  MICRO DATA  None  ANTIBIOTICS None  CONSULTS Pulmonology Interventional Radiology  TUBES / LINES Left pleural Wayne Catheter 3/6 >>> 3/10 Left pleural PleurX Catheter 3/10 >>>   Discharge Exam: General: Pleasant male, resting in bed, in NAD. Neuro: A&O x 3, non-focal.  HEENT: [place]/AT. PERRL, sclerae anicteric. Cardiovascular: RRR, no M/R/G.  Lungs: Respirations even and unlabored, slightly diminished on left.  CTA, No W/R/R. Abdomen: BS x 4, soft, NT/ND.  Musculoskeletal: No gross deformities, no edema.  Skin: Intact, warm, no rashes.    Filed Vitals:   03/14/13 1754 03/14/13 1820 03/14/13 2224 03/15/13 0515  BP: 103/67 87/73 133/83 114/78  Pulse: 98 98 99 93  Temp: 98.8 F (37.1 C) 98.5  F (36.9 C) 98.4 F (36.9 C) 98.2 F (36.8 C)  TempSrc: Oral Oral Oral   Resp: 17 16 16 17   Height:      Weight:      SpO2: 94% 95% 99% 98%     Discharge Labs  BMET  Recent Labs Lab 03/08/13 1704 03/11/13 0405  NA 131* 136*  K 4.2 4.4  CL 96 101  CO2 22 21  GLUCOSE 125* 105*  BUN 17 15  CREATININE 0.9 0.85  CALCIUM 9.2 9.0    CBC  Recent Labs Lab 03/11/13 0405  HGB 12.8*  HCT 37.3*  WBC 7.0  PLT 489*    Anti-Coagulation  Recent Labs Lab 03/11/13 0405  INR 1.05    Discharge Orders   Future Appointments Provider Department Dept Phone   03/22/2013 11:15 AM Collene Gobble, MD [facility] [phone]   Future Orders Complete By Expires   Call MD for:   difficulty breathing, headache or visual disturbances  As directed    Call MD for:  redness, tenderness, or signs of infection (pain, swelling, redness, odor or green/yellow discharge around incision site)  As directed    Call MD for:  severe uncontrolled pain  As directed    Call MD for:  temperature >100.4  As directed    Diet - low sodium heart healthy  As directed    Discharge instructions  As directed    Comments:     Please follow up with Dr. Earlie Server   Increase activity slowly  As directed            Follow-up Information   Follow up with Unice Cobble, MD. (As needed)    Specialty:  Internal Medicine   Contact information:   [address] [place] [address] [phone]       Follow up with Eilleen Kempf., MD On 03/22/2013. (Appointment is at 2:00 PM)    Specialty:  Oncology   Contact information:   [place] [address] [phone]       Follow up with Collene Gobble., MD On 03/22/2013. (Appointment is at 11:15AM)    Specialty:  Pulmonary Disease   Contact information:   [address] [place] [address] [phone]        Medication List         allopurinol 100 MG tablet  Commonly known as:  ZYLOPRIM  Take 100 mg by mouth daily.     amLODipine 5 MG tablet  Commonly known as:  NORVASC  Take 5 mg by mouth daily.     aspirin EC 81 MG tablet  Take 81 mg by mouth every morning.     atorvastatin 10 MG tablet  Commonly known as:  LIPITOR  Take 10 mg by mouth 3 (three) times a week. Monday, Wednesday, and friday     colchicine 0.6 MG tablet  Take 0.6 mg by mouth 2 (two) times daily.     fenofibrate 160 MG tablet  Take 160 mg by mouth daily.     fish oil-omega-3 fatty acids 1000 MG capsule  Take 2 g by mouth. 2 by mouth in the am, 2 by mouth in the pm     lisinopril 20 MG tablet  Commonly known as:  PRINIVIL,ZESTRIL  Take 20 mg by mouth daily.     metFORMIN 500 MG tablet  Commonly known as:  GLUCOPHAGE  Take 500  mg by mouth 2 (two) times daily with a meal.  oxyCODONE-acetaminophen 5-325 MG per tablet  Commonly known as:  PERCOCET/ROXICET  Take 1-2 tablets by mouth every 4 (four) hours as needed for severe pain.     traMADol 50 MG tablet  Commonly known as:  ULTRAM  Take 50 mg by mouth every 6 (six) hours as needed for moderate pain.     TURMERIC PO  Take 1 tablet by mouth 2 (two) times daily.          Disposition: Home.  Discharged Condition: Harm Jou has met maximum benefit of inpatient care and is medically stable and cleared for discharge.  Patient is pending follow up as above.      Time spent on disposition:  Greater than 35 minutes.   Montey Hora, [facility] Pgr: [phone]  or [phone]  Baltazar Apo, MD, PhD 03/15/2013, 5:57 PM [facility] [phone] or if no answer [phone]

## 2013-03-15 NOTE — Telephone Encounter (Signed)
Spoke with Tammy and Dr Avis Epley at [facility] Pts pathology for the pleural fluid shows malignant cells.  They are continuing to stain these Final results should be available by 3/12-3/13/15  Dr Truman Hayward will be handling the results once complete as Dr Avis Epley will not be in office.

## 2013-03-15 NOTE — Progress Notes (Signed)
Discharge Note. Discharge instructions and education done with pt and pt's family at the bedside. All home medications reviewed. Rx for pain given to pt. Reviewed follow up appointments and care of the Aspira drain. Home health will also be coming to help with the drain and dressing change. Materials given to pt to help in that care. Pt and family ask appropriate questions. Pt reported that he, "has had great care here." Pt reports that he is ready to go home.

## 2013-03-15 NOTE — Care Management Note (Signed)
    Page 1 of 1   03/15/2013     11:02:01 AM   CARE MANAGEMENT NOTE 03/15/2013  Patient:  BURR, SOFFER   Account Number:  [id number]  Date Initiated:  03/15/2013  Documentation initiated by:  Magdalen Spatz  Subjective/Objective Assessment:     Action/Plan:   Anticipated DC Date:  03/15/2013   Anticipated DC Plan:  [place]         Choice offered to / List presented to:  C-1 Patient        [place] arranged  HH-1 RN      [place].   Status of service:  Completed, signed off Medicare Important Message given?   (If response is "NO", the following Medicare IM given date fields will be blank) Date Medicare IM given:   Date Additional Medicare IM given:    Discharge Disposition:    Per UR Regulation:    If discussed at Long Length of Stay Meetings, dates discussed:    Comments:  03-15-13 Confirmed face sheet information with patient and wife.  Wife's cell phone number is [place] [phone]

## 2013-03-15 NOTE — Progress Notes (Signed)
Subjective: Pt ok. S/p conversion of (L)pigtail chest drain to Bard Aspira tunneled catheter. Has drained about   Objective: Physical Exam: BP 114/78  Pulse 93  Temp(Src) 98.2 F (36.8 C) (Oral)  Resp 17  Ht 5\' 8"  (1.727 m)  Wt 180 lb (81.647 kg)  BMI 27.38 kg/m2  SpO2 98% (L)drain intact, site clean. New sterile dressing applied. Drain capped.    Labs: CBC No results found for this basename: WBC, HGB, HCT, PLT,  in the last 72 hours BMET No results found for this basename: NA, K, CL, CO2, GLUCOSE, BUN, CREATININE, CALCIUM,  in the last 72 hours LFT No results found for this basename: PROT, ALBUMIN, AST, ALT, ALKPHOS, BILITOT, BILIDIR, IBILI, LIPASE,  in the last 72 hours PT/INR No results found for this basename: LABPROT, INR,  in the last 72 hours   Studies/Results: Dg Chest 2 View  03/14/2013   CLINICAL DATA Evaluate new pleural drain  EXAM CHEST  2 VIEW  COMPARISON IR ABSCESS DRAINAGE dated 03/14/2013; DG CHEST 1V PORT dated 03/13/2013  FINDINGS Stable mild cardiac enlargement.  Right lung remains clear.  On left side, there is a small to moderate loculated pleural effusions seen along the lateral and inferior aspect of the thorax. There is a left chest tube in place, which extends along the inferior aspect of the thorax for the medial pleural surface and than extends cranially with its tip at the level of the carina. There is no change in the appearance of left effusion since the prior study. Left lower lobe consolidation is also unchanged.  IMPRESSION Stable left pleural effusion.  No pneumothorax.  SIGNATURE  Electronically Signed   By: Skipper Cliche M.D.   On: 03/14/2013 16:44   Ir Guided Niel Hummer W Catheter Placement  03/14/2013   CLINICAL DATA 68 year old male with history of a large left pleural effusion. Left pleural effusion was treated with aWayne chest tube. Patient needs a chest tube after discharge.  EXAM PLACEMENT OF TUNNELED PLEURAL CATHETER WITH FLUOROSCOPY   Physician: Stephan Minister. Henn, MD  FLUOROSCOPY TIME 2 min and 24 seconds  MEDICATIONS 3 mg versed, 100 mcg fentanyl. A radiology nurse monitored the patient for moderate sedation. Ancef 2 g. As antibiotic prophylaxis, Ancef was ordered pre-procedure and administered intravenously within one hour of incision.  ANESTHESIA/SEDATION Moderate sedation time: 15 min  PROCEDURE The procedure was explained to the patient. The risks and benefits of the procedure were discussed and the patient's questions were addressed. Informed consent was obtained from the patient. The patient was placed on the interventional table. The existing chest tube was prepped and draped in a sterile fashion. Maximal barrier sterile technique was utilized including caps, mask, sterile gowns, sterile gloves, sterile drape, hand hygiene and skin antiseptic.  The skin around the catheter was anesthetized with 1% lidocaine. An incision was made approximately 5 cm caudal to the existing chest tube site. Aspira drain was placed through a subcutaneous tunnel from this skin incision to the existing chest tube site. The chest tube was removed over a Bentson wire and Kumpe the catheter. A peel-away sheath was placed over the wire. The wire was removed and the pleural catheter was advanced into the pleural space with fluoroscopy. Small amount of blood-tinged pleural fluid was removed. The old catheter skin site was sutured with 2-0 Ethilon. Catheter was sutured to the skin with 0-Prolene. Fluoroscopic images were taken and saved for this procedure.  FINDINGS The pigtail chest tube was exchanged for the Aspira  catheter. Catheter placement was confirmed within the pleural space with fluoroscopy.  COMPLICATIONS None  IMPRESSION Successful conversion of the left chest tube for a tunneled pleural catheter.  SIGNATURE  Electronically Signed   By: Markus Daft M.D.   On: 03/14/2013 17:23    Assessment/Plan: (L)pleural effusion. Discussed with family trying to establish  a schedule of how often to empty drain. If only 300-400 per 24hrs, then likely an every other day schedule, but pt feels he may need daily drainage until he is more comfortable with this. D/w SW/Case mgmt regarding HH RN to assist with teaching family drainage and dressing change.    LOS: 5 days    Ascencion Dike PA-C 03/15/2013 10:49 AM

## 2013-03-16 ENCOUNTER — Telehealth: Payer: Self-pay | Admitting: Internal Medicine

## 2013-03-16 NOTE — Telephone Encounter (Signed)
Rhonda with [facility] is calling to let Dr. Linna Darner know that the patient's care with them will start tomorrow. She did not want a visit today. No call back requested.

## 2013-03-17 ENCOUNTER — Encounter: Payer: Self-pay | Admitting: Emergency Medicine

## 2013-03-20 ENCOUNTER — Telehealth: Payer: Self-pay | Admitting: Internal Medicine

## 2013-03-20 NOTE — Telephone Encounter (Signed)
returned pt call...s.w pt and r/s lab for tomorrow per pt request....pt aware

## 2013-03-21 ENCOUNTER — Other Ambulatory Visit: Payer: Self-pay | Admitting: Medical Oncology

## 2013-03-21 ENCOUNTER — Other Ambulatory Visit (HOSPITAL_BASED_OUTPATIENT_CLINIC_OR_DEPARTMENT_OTHER): Payer: Medicare Other

## 2013-03-21 ENCOUNTER — Telehealth: Payer: Self-pay | Admitting: Internal Medicine

## 2013-03-21 DIAGNOSIS — R918 Other nonspecific abnormal finding of lung field: Secondary | ICD-10-CM

## 2013-03-21 LAB — CBC WITH DIFFERENTIAL/PLATELET
BASO%: 1.1 % (ref 0.0–2.0)
Basophils Absolute: 0.1 10*3/uL (ref 0.0–0.1)
EOS%: 4.2 % (ref 0.0–7.0)
Eosinophils Absolute: 0.4 10*3/uL (ref 0.0–0.5)
HEMATOCRIT: 37.4 % — AB (ref 38.4–49.9)
HGB: 12.4 g/dL — ABNORMAL LOW (ref 13.0–17.1)
LYMPH%: 22.9 % (ref 14.0–49.0)
MCH: 28 pg (ref 27.2–33.4)
MCHC: 33.1 g/dL (ref 32.0–36.0)
MCV: 84.5 fL (ref 79.3–98.0)
MONO#: 1.1 10*3/uL — ABNORMAL HIGH (ref 0.1–0.9)
MONO%: 11.4 % (ref 0.0–14.0)
NEUT#: 5.9 10*3/uL (ref 1.5–6.5)
NEUT%: 60.4 % (ref 39.0–75.0)
PLATELETS: 625 10*3/uL — AB (ref 140–400)
RBC: 4.42 10*6/uL (ref 4.20–5.82)
RDW: 13.1 % (ref 11.0–14.6)
WBC: 9.7 10*3/uL (ref 4.0–10.3)
lymph#: 2.2 10*3/uL (ref 0.9–3.3)

## 2013-03-21 LAB — COMPREHENSIVE METABOLIC PANEL (CC13)
ALK PHOS: 63 U/L (ref 40–150)
ALT: 24 U/L (ref 0–55)
AST: 12 U/L (ref 5–34)
Albumin: 2.8 g/dL — ABNORMAL LOW (ref 3.5–5.0)
Anion Gap: 10 mEq/L (ref 3–11)
BILIRUBIN TOTAL: 0.2 mg/dL (ref 0.20–1.20)
BUN: 20.4 mg/dL (ref 7.0–26.0)
CALCIUM: 10.7 mg/dL — AB (ref 8.4–10.4)
CO2: 23 mEq/L (ref 22–29)
Chloride: 103 mEq/L (ref 98–109)
Creatinine: 0.8 mg/dL (ref 0.7–1.3)
Glucose: 92 mg/dl (ref 70–140)
Potassium: 4.9 mEq/L (ref 3.5–5.1)
SODIUM: 136 meq/L (ref 136–145)
Total Protein: 7 g/dL (ref 6.4–8.3)

## 2013-03-21 NOTE — Telephone Encounter (Signed)
pt called and needed to cx appt....will call back to r/s

## 2013-03-22 ENCOUNTER — Other Ambulatory Visit: Payer: Medicare Other

## 2013-03-22 ENCOUNTER — Ambulatory Visit: Payer: Medicare Other | Admitting: Internal Medicine

## 2013-03-22 ENCOUNTER — Encounter: Payer: Self-pay | Admitting: Emergency Medicine

## 2013-03-22 ENCOUNTER — Ambulatory Visit (INDEPENDENT_AMBULATORY_CARE_PROVIDER_SITE_OTHER): Payer: Medicare Other | Admitting: Emergency Medicine

## 2013-03-22 ENCOUNTER — Ambulatory Visit: Payer: Medicare Other

## 2013-03-22 VITALS — BP 128/88 | HR 117 | Ht 68.0 in | Wt 169.0 lb

## 2013-03-22 DIAGNOSIS — J910 Malignant pleural effusion: Secondary | ICD-10-CM

## 2013-03-22 MED ORDER — OXYCODONE-ACETAMINOPHEN 5-325 MG PO TABS: 1.0000 | ORAL_TABLET | ORAL | Status: AC | PRN

## 2013-03-22 NOTE — Patient Instructions (Signed)
You may want to decrease the frequency with which you drain your pleural fluid.  Please follow with [facility] on 3/19 as planned.  Use Percocet as needed for pain.  Follow with Dr Lamonte Sakai in 3 months or sooner if you have any problems.

## 2013-03-22 NOTE — Progress Notes (Signed)
Subjective:    Patient ID: Derrick Singleton, male    DOB: March 02, 1945, 68 y.o.   MRN: [id number]  HPI 68 yo man, former smoker (15 pk-yrs), Hx HTN (on ACE-I), gout, hyperlipidemia. Followed by Dr Linna Darner and referred for dyspnea and abnormal CXR done 03/03/13 > shows moderate to large L effusion with LLL atx/infiltrate + additional LUL nodular disease. He began to have acute SOB on 2/22 with some L pleuritic CP. The CP has improved but he remains dyspneic. He has had cough for a week, non-productive. No hemoptysis. He has gained a few lbs over the last few weeks, but he indicates poor appetite. His stomach feels full, distended. He has had some constipation.   ROV post-hosp 03/22/13 -- follows up for LUL cavitary nodule and malignant effusion. He underwent drainage and ultimately pleurex insertion. 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 He is to see Oncology at [facility] tomorrow. He is draining the pleurex daily. The amount has decreased. He is still having pleuritic pain.   No results found for this basename: HGB, HCT, WBC, PLT,  in the last 168 hours D-dimer 1.12 (sliightly elevated)  Review of Systems  Constitutional: Positive for unexpected weight change. Negative for fever.  HENT: Positive for dental problem. Negative for congestion, ear pain, nosebleeds, postnasal drip, rhinorrhea, sinus pressure, sneezing, sore throat and trouble swallowing.   Eyes: Negative for redness and itching.  Respiratory: Positive for cough, chest tightness, shortness of breath and wheezing.   Cardiovascular: Negative for palpitations and leg swelling.  Gastrointestinal: Negative for nausea and vomiting.  Genitourinary: Negative for dysuria.  Musculoskeletal: Negative for joint swelling.  Skin: Negative for rash.  Neurological: Negative for headaches.  Hematological: Does not bruise/bleed easily.  Psychiatric/Behavioral: Negative for dysphoric mood. The patient is not  nervous/anxious.    Past Medical History  Diagnosis Date  . Gout   . Hyperlipemia   . Hypertension   . Low testosterone   . Pleural effusion 03/10/2013    left lung            DR SIMONDS  . Diabetes mellitus without complication     TYPE 2 NO MEDICATIONS AT THIS TIME      Family History  Problem Relation Age of Onset  . Lung cancer Father     smoker  . Heart disease Neg Hx   . Stroke Neg Hx   . Diabetes Neg Hx      History   Social History  . Marital Status: Married    Spouse Name: N/A    Number of Children: N/A  . Years of Education: N/A   Occupational History  . Not on file.   Social History Main Topics  . Smoking status: Former Smoker -- 0.50 packs/day for 30 years    Types: Cigarettes    Quit date: 01/05/2006  . Smokeless tobacco: Never Used     Comment: smoked  1968 - 2008, up to 1  ppd  . Alcohol Use: Yes     Comment:  socially  . Drug Use: No  . Sexual Activity: Not on file   Other Topics Concern  . Not on file   Social History Narrative  . No narrative on file     No Known Allergies   Outpatient Prescriptions Prior to Visit  Medication Sig Dispense Refill  . allopurinol (ZYLOPRIM) 100 MG tablet Take 100 mg by mouth daily.      Marland Kitchen amLODipine (NORVASC) 5 MG tablet  Take 5 mg by mouth daily.      Marland Kitchen aspirin EC 81 MG tablet Take 81 mg by mouth every morning.       Marland Kitchen atorvastatin (LIPITOR) 10 MG tablet Take 10 mg by mouth 3 (three) times a week. Monday, Wednesday, and friday      . colchicine 0.6 MG tablet Take 0.6 mg by mouth 2 (two) times daily.      . fenofibrate 160 MG tablet Take 160 mg by mouth daily.      . fish oil-omega-3 fatty acids 1000 MG capsule Take 2 g by mouth. 2 by mouth in the am, 2 by mouth in the pm      . lisinopril (PRINIVIL,ZESTRIL) 20 MG tablet Take 20 mg by mouth daily.      . metFORMIN (GLUCOPHAGE) 500 MG tablet Take 500 mg by mouth 2 (two) times daily with a meal.       . TURMERIC PO Take 1 tablet by mouth 2 (two) times  daily.       Marland Kitchen oxyCODONE-acetaminophen (PERCOCET/ROXICET) 5-325 MG per tablet Take 1-2 tablets by mouth every 4 (four) hours as needed for severe pain.  30 tablet  0  . traMADol (ULTRAM) 50 MG tablet Take 50 mg by mouth every 6 (six) hours as needed for moderate pain.       No facility-administered medications prior to visit.       Objective:   Physical Exam Filed Vitals:   03/22/13 1144  BP: 128/88  Pulse: 117  Height: 5\' 8"  (1.727 m)  Weight: 169 lb (76.658 kg)  SpO2: 98%   Gen: Pleasant, well-nourished, in no distress,  normal affect  ENT: No lesions,  mouth clear,  oropharynx clear, no postnasal drip  Neck: No JVD, no TMG, no carotid bruits  Lungs: No use of accessory muscles, very decreased L base, clear on R without rales or rhonchi  Cardiovascular: RRR, heart sounds normal, no murmur or gallops, no peripheral edema  Musculoskeletal: No deformities, no cyanosis or clubbing  Neuro: alert, non focal  Skin: Warm, no lesions or rashes      Assessment & Plan:  Pleural effusion, malignant You may want to decrease the frequency with which you drain your pleural fluid.  Please follow with [facility] on 3/19 as planned.  Use Percocet as needed for pain.  Follow with Dr Lamonte Sakai in 3 months or sooner if you have any problems.

## 2013-03-25 ENCOUNTER — Encounter: Payer: Self-pay | Admitting: Emergency Medicine

## 2013-03-28 NOTE — Telephone Encounter (Signed)
Pt seen 3.18.15 by RB: Patient Instructions     You may want to decrease the frequency with which you drain your pleural fluid.  Please follow with [facility] on 3/19 as planned.  Use Percocet as needed for pain.  Follow with Dr Lamonte Sakai in 3 months or sooner if you have any problems.    Most recent mychart email from pt: Composed 03/27/2013 7:03 PM  For Delivery On 03/27/2013 7:03 PM  Subject RE: Non-Urgent Medical Question  Is any body in [facility] started the the process of molecular testing. If yes, can i get the results. thanks.  Ronav Goody   Email response sent back to pt informing him that we will forward this question to RB and get back with him.

## 2013-04-03 DIAGNOSIS — I10 Essential (primary) hypertension: Secondary | ICD-10-CM

## 2013-04-03 DIAGNOSIS — E785 Hyperlipidemia, unspecified: Secondary | ICD-10-CM

## 2013-04-03 DIAGNOSIS — M109 Gout, unspecified: Secondary | ICD-10-CM

## 2013-04-03 DIAGNOSIS — J90 Pleural effusion, not elsewhere classified: Secondary | ICD-10-CM

## 2013-04-03 DIAGNOSIS — E119 Type 2 diabetes mellitus without complications: Secondary | ICD-10-CM

## 2013-04-14 ENCOUNTER — Telehealth: Payer: Self-pay | Admitting: Emergency Medicine

## 2013-04-14 NOTE — Telephone Encounter (Signed)
Called spoke with Lavella Lemons from [facility]. She reports pt was d/c from [facility] yesterday. Pt has lung cancer,  pleural effusion, pleurix drain. Can't get sats out of the 80's. Pt not in extreme resp distress, his respirations are even and not labored, SOB w/ activity. Pt does not have O2 in the home. She is going to send him to the ED since we do not have qualifications for pt to get O2. FYI for [place].

## 2013-04-18 ENCOUNTER — Telehealth: Payer: Self-pay | Admitting: *Deleted

## 2013-04-18 NOTE — Telephone Encounter (Addendum)
Received message on (04-14-13) from Mayra Neer with [facility](#[phone]) requesting a order for Home O2 therapy for the pt.  Pt's sat's in the 80's and SOB w/ activity, and they would like to get O2 for the home to keep out of the hospital.    Called and spoke with Lavella Lemons and she stated that she talked with Almyra Free from oncology and they are going to have the pt go back to the hospital.  Just a FYI for us.//AB/CMA

## 2013-04-20 ENCOUNTER — Telehealth: Payer: Self-pay | Admitting: Internal Medicine

## 2013-04-20 NOTE — Telephone Encounter (Signed)
   Please re FAX wheelchair request. It may be in the voluminous papers I just received from [facility].  Please fax them copies of our last office visit.

## 2013-04-20 NOTE — Telephone Encounter (Signed)
Merry Proud with [facility] is calling to see if we have received the statement of medical necessity for the patient's tranport wheelchair.  Also, pt's insurance needs a copy of the pt's last OV note to be faxed back along with it. Please advise. Merry Proud can be reached back at [phone] if this needs to be re-faxed.

## 2013-05-03 ENCOUNTER — Encounter (HOSPITAL_COMMUNITY): Payer: Self-pay

## 2013-06-03 ENCOUNTER — Encounter: Payer: Self-pay | Admitting: Internal Medicine

## 2013-06-03 DIAGNOSIS — R6521 Severe sepsis with septic shock: Secondary | ICD-10-CM

## 2013-06-03 DIAGNOSIS — A419 Sepsis, unspecified organism: Secondary | ICD-10-CM | POA: Insufficient documentation

## 2013-06-05 DEATH — deceased

## 2013-07-13 ENCOUNTER — Telehealth: Payer: Self-pay

## 2013-07-13 NOTE — Telephone Encounter (Signed)
Diabetic bundle-a1c and bmet ordered Left message on pt VM TCB and schedule follow up appointment

## 2013-10-02 NOTE — Assessment & Plan Note (Signed)
You may want to decrease the frequency with which you drain your pleural fluid.  Please follow with [facility] on 3/19 as planned.  Use Percocet as needed for pain.  Follow with Dr Lamonte Sakai in 3 months or sooner if you have any problems.

## 2015-01-13 IMAGING — CT CT ANGIO CHEST
2 of 6 series · 18 of 36 positions shown · IV contrast (Omnipaque 300)
Comparison: DG CHEST 2 VIEW dated 03/03/2013

CLINICAL DATA: Acute onset short of breath. Left-sided chest pain
for 1 week. Abnormal chest radiograph.

EXAM:
CT ANGIOGRAPHY CHEST WITH CONTRAST
TECHNIQUE: Multidetector CT imaging of the chest was performed using the
standard protocol during bolus administration of intravenous
contrast. Multiplanar CT image reconstructions and MIPs were
obtained to evaluate the vascular anatomy.
CONTRAST:  80mL OMNIPAQUE IOHEXOL 350 MG/ML SOLN

[Series 5: thins (id) / (id) · axial · 0.75mm/px · z∈[-337,-32]mm · 17 of 339 slices shown]
[im 17/339  lung]
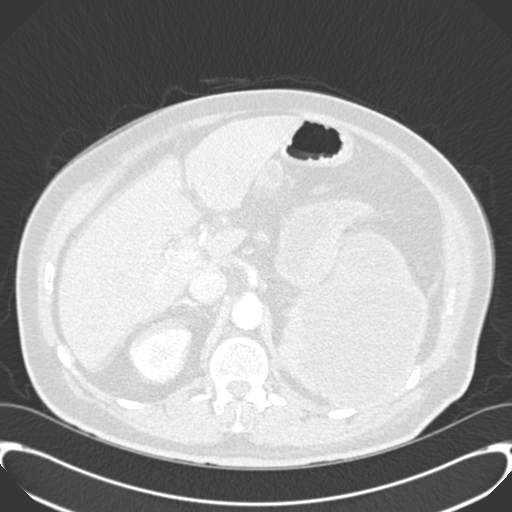
[im 34/339  mediastinal]
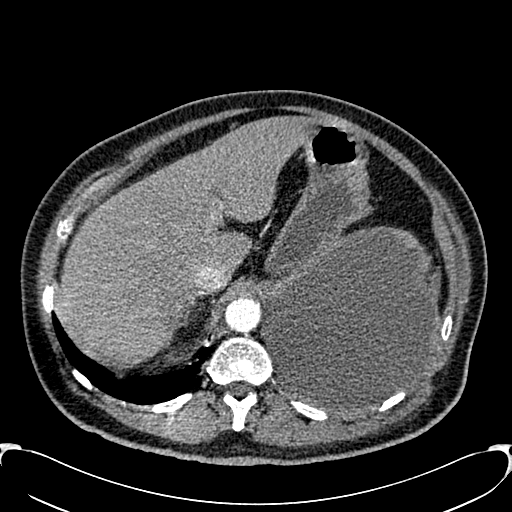
[im 51/339  lung]
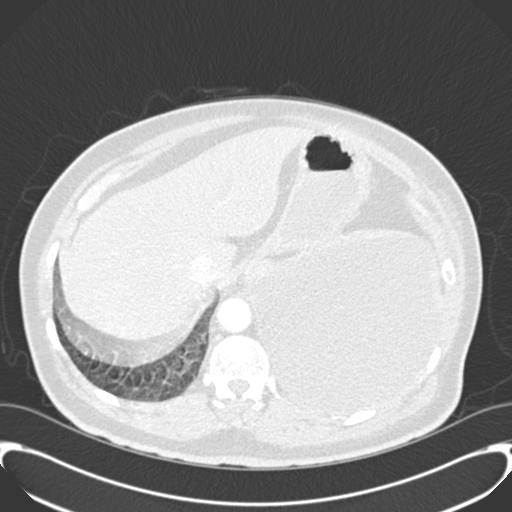
[im 68/339  mediastinal]
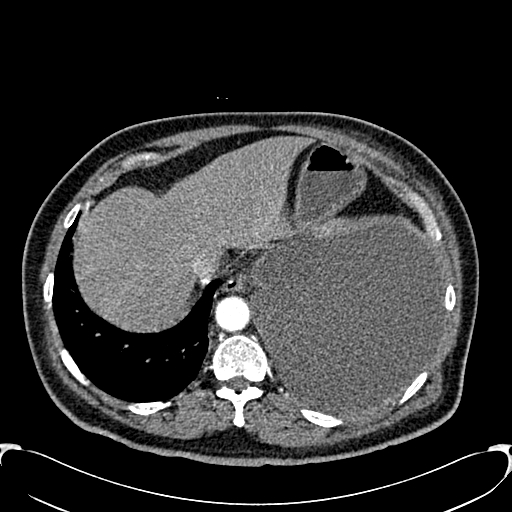
[im 102/339  lung]
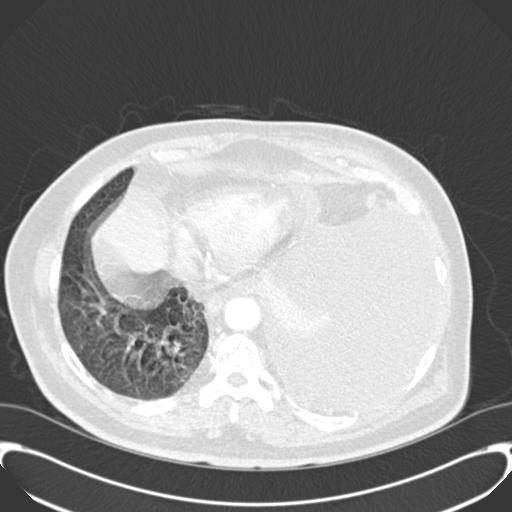
[im 119/339  mediastinal]
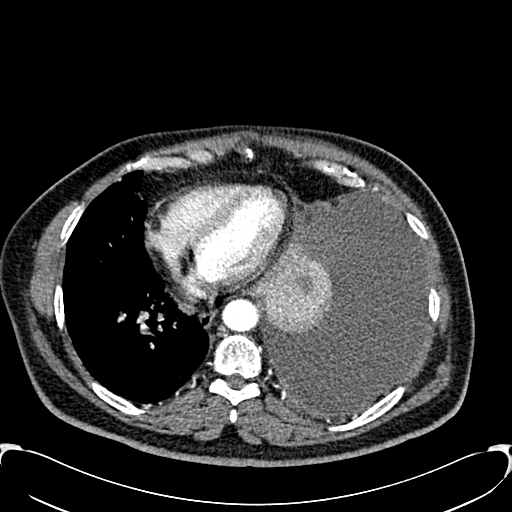
[im 136/339  lung]
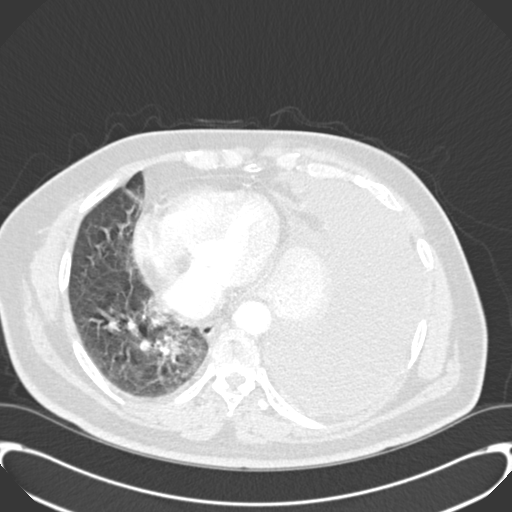
[im 153/339  mediastinal]
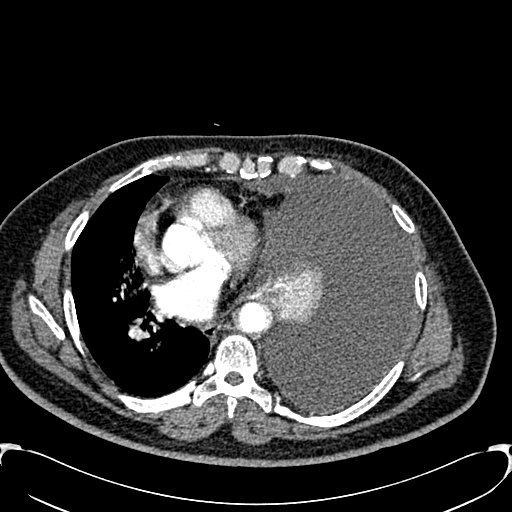
[im 170/339  lung]
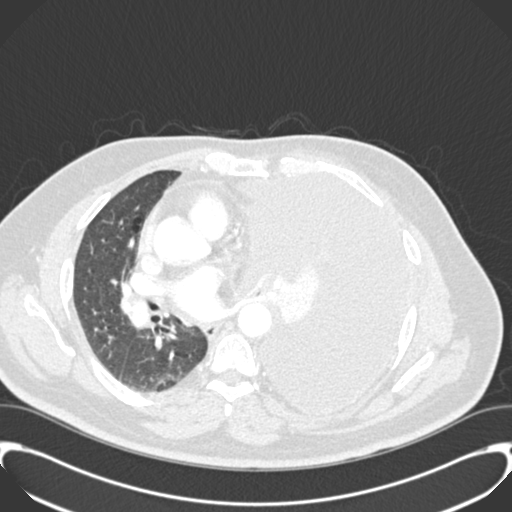
[im 186/339  mediastinal]
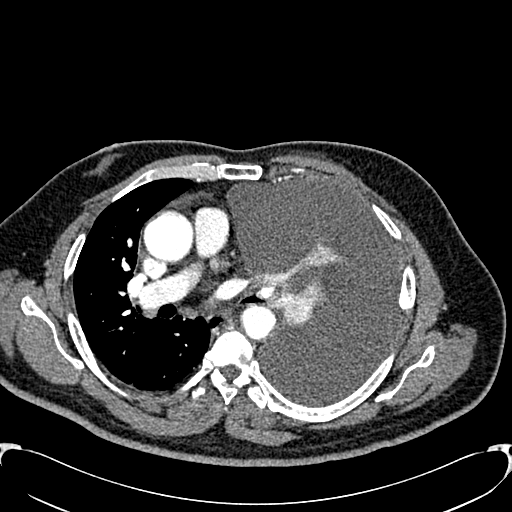
[im 203/339  lung]
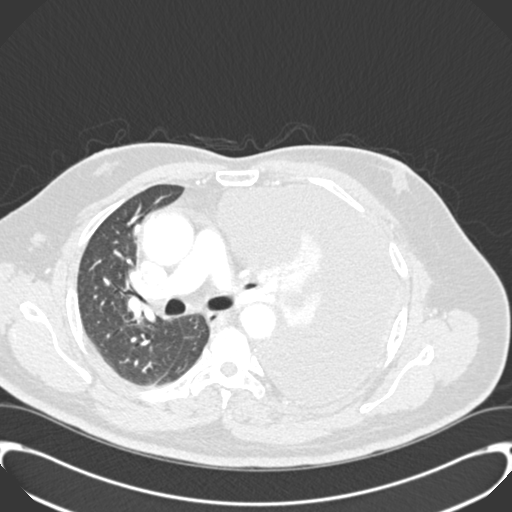
[im 220/339  mediastinal]
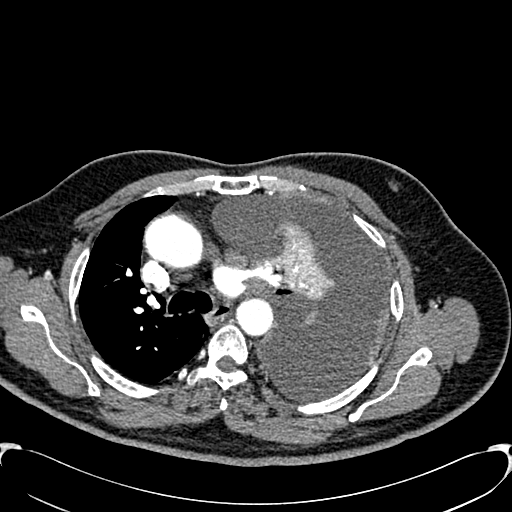
[im 237/339  lung]
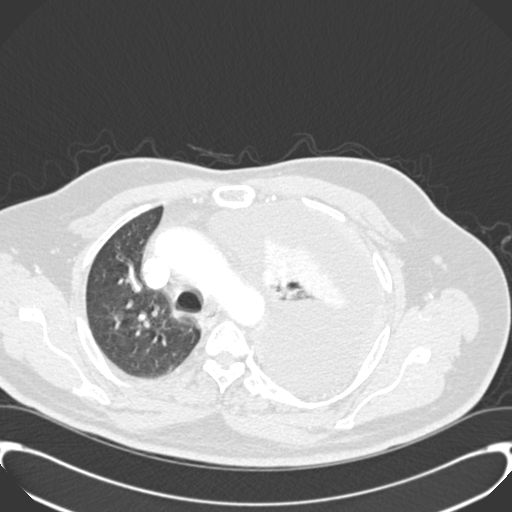
[im 271/339  mediastinal]
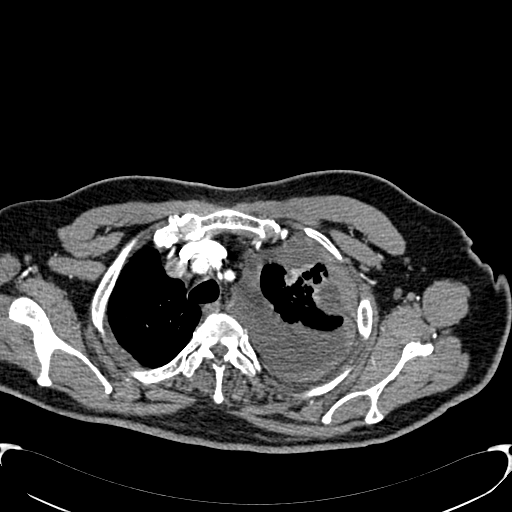
[im 288/339  lung]
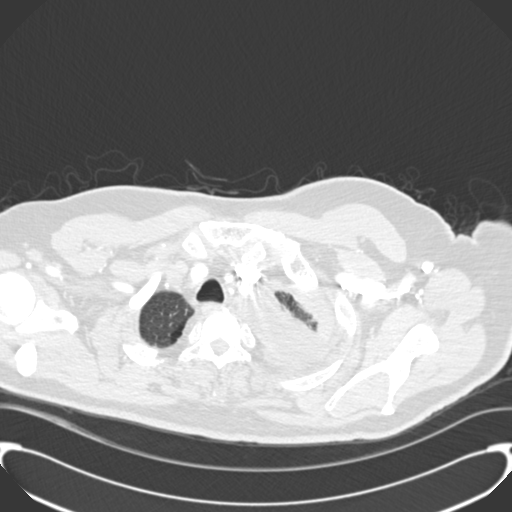
[im 305/339  mediastinal]
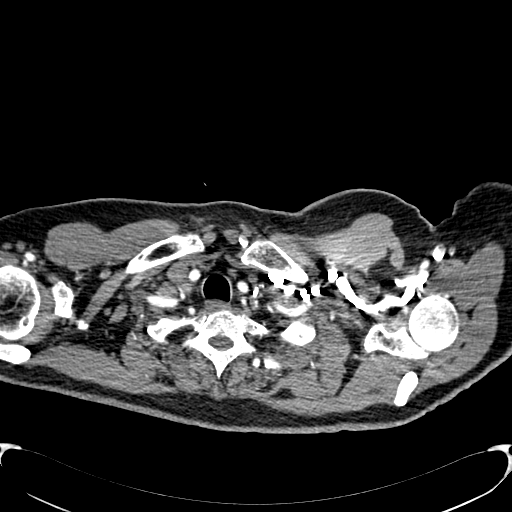
[im 322/339  lung]
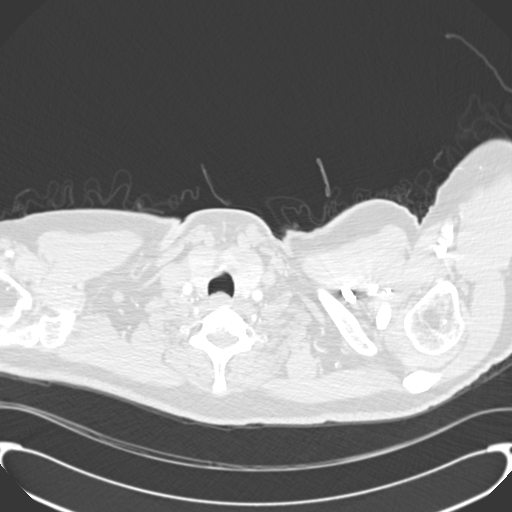

[Series 602: cor mpr · coronal · 0.75mm/px · 1 of 106 slices shown]
[im 53/106  mediastinal]
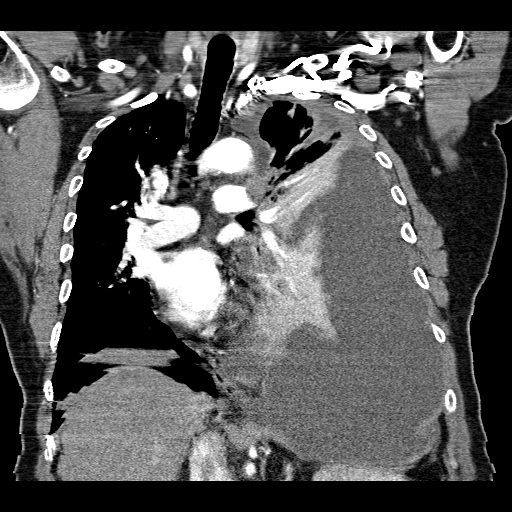

[18 of 36 positions shown; findings below may reference images not displayed]

FINDINGS: There is a massive left pleural effusion with rightward mediastinal
shift. Enhancing soft tissue is present along the lateral left upper
lobe pleural surface, compatible with pleural carcinomatosis. The
left lower lobe is compressed and the majority of the left upper
lobe is compressed. There is focal fluid that appears to be within
the lung parenchyma in the left upper lobe (image 22 series 4)
measuring 23 mm x 26 mm. This may represent a primary neoplasm or
metastatic lesion.

Hilar adenopathy poorly evaluated in the presence of a large
effusion. AP window lymphadenopathy is present with 13 mm node
(image 38 series 4). Subcarinal node appears within normal limits.
No contralateral hilar adenopathy in the right lung. There is no
airspace disease or right pleural effusion. Suboptimal evaluation of
the right lower lobe due to respiratory motion artifact. Small focus
of ground-glass attenuation in the right upper lobe is nonspecific
and can be followed. Right paraseptal emphysema incidentally noted.
No acute aortic abnormality. No pulmonary embolus identified.
Incidental imaging the upper abdomen is grossly within normal
limits. The massive left pleural effusion versus the left
hemidiaphragm. Respiratory motion degrades evaluation of the upper
abdomen is well. There is no axillary adenopathy. No definite
pericardial effusion. Coronary artery atherosclerosis is present. If
office based assessment of coronary risk factors has not been
performed, it is now recommended. Central airways grossly patent.

No aggressive osseous lesions. Sclerotic focus in the T10 vertebra
on the right probably represents benign hemangioma. Bilateral
gynecomastia incidentally noted.

Review of the MIP images confirms the above findings.
IMPRESSION: 1. Massive malignant left pleural effusion, with pleural implants
compatible with pleural carcinomatosis. Thoracentesis for cytology
should be considered. AP window adenopathy.
2. Possible necrotic primary or metastatic lesion in the left upper
lobe measuring 26 mm x 23 mm (image 22 series [DATE]. Compressive atelectasis of the left upper and lower lobes and
right upper mediastinal shift capacious pulmonary capacity.

## 2015-01-15 IMAGING — CR DG CHEST 1V PORT
1 series · 1 of 1 positions shown · non-contrast
Comparison: DG CHEST 1 VIEW dated 03/10/2013; CT ANGIO CHEST W/CM
&/OR WO/CM dated 03/09/2013

CLINICAL DATA: Pleural effusion

EXAM:
PORTABLE CHEST - 1 VIEW

[AP]
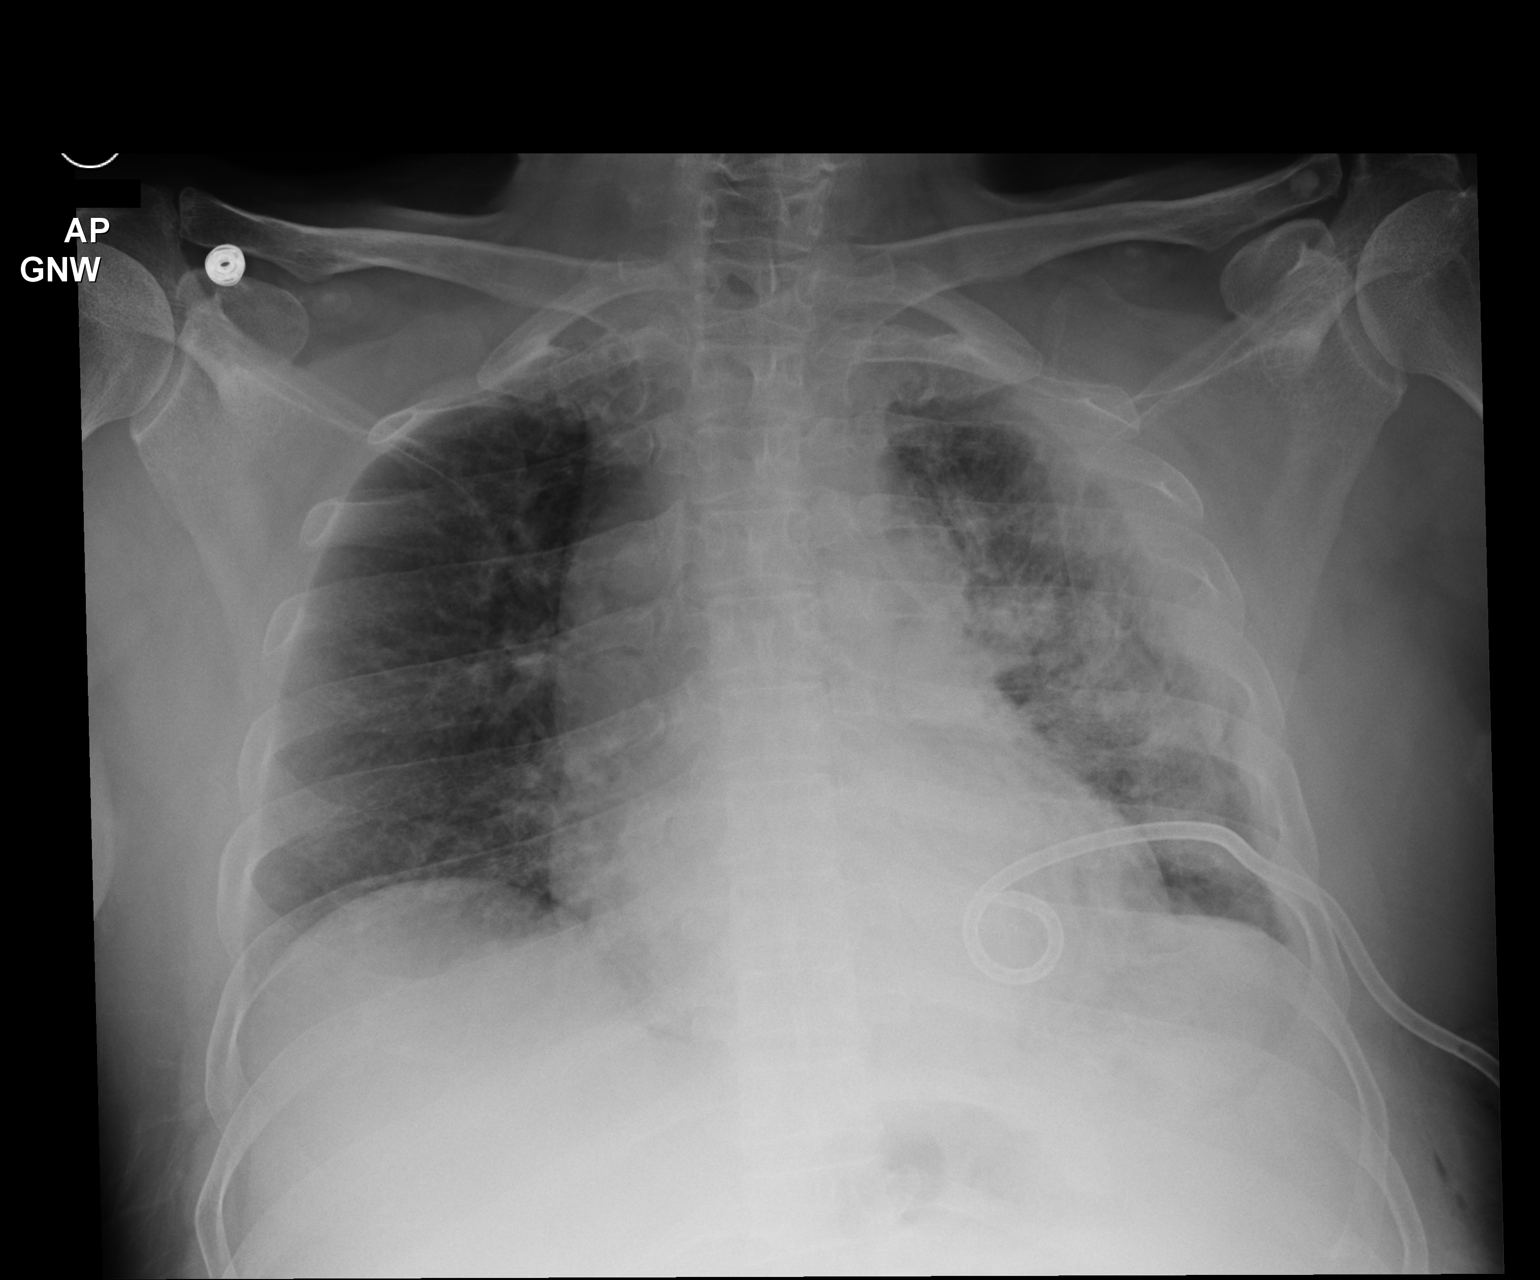

[1 of 1 positions shown; findings below may reference images not displayed]

FINDINGS: Left pleural drainage catheter. Persistent left pleural thickening
compatible with pleural fluid. Mildly improved aeration in the left
lung but with residual airspace opacity noted. Improved definition
of the left hemidiaphragm.

Mild cardiomegaly. Poor definition of the AP window compatible with
adenopathy.
IMPRESSION: 1. Pleural thickening on the left likely represent some residual
pleural fluid and perhaps pleural tumor. Improved aeration in the
left lung.
2. Mild cardiomegaly.
3. AP window adenopathy.

## 2015-01-18 IMAGING — CR DG CHEST 2V
3 series · 3 of 3 positions shown · non-contrast
Comparison: none

[w chest lat]
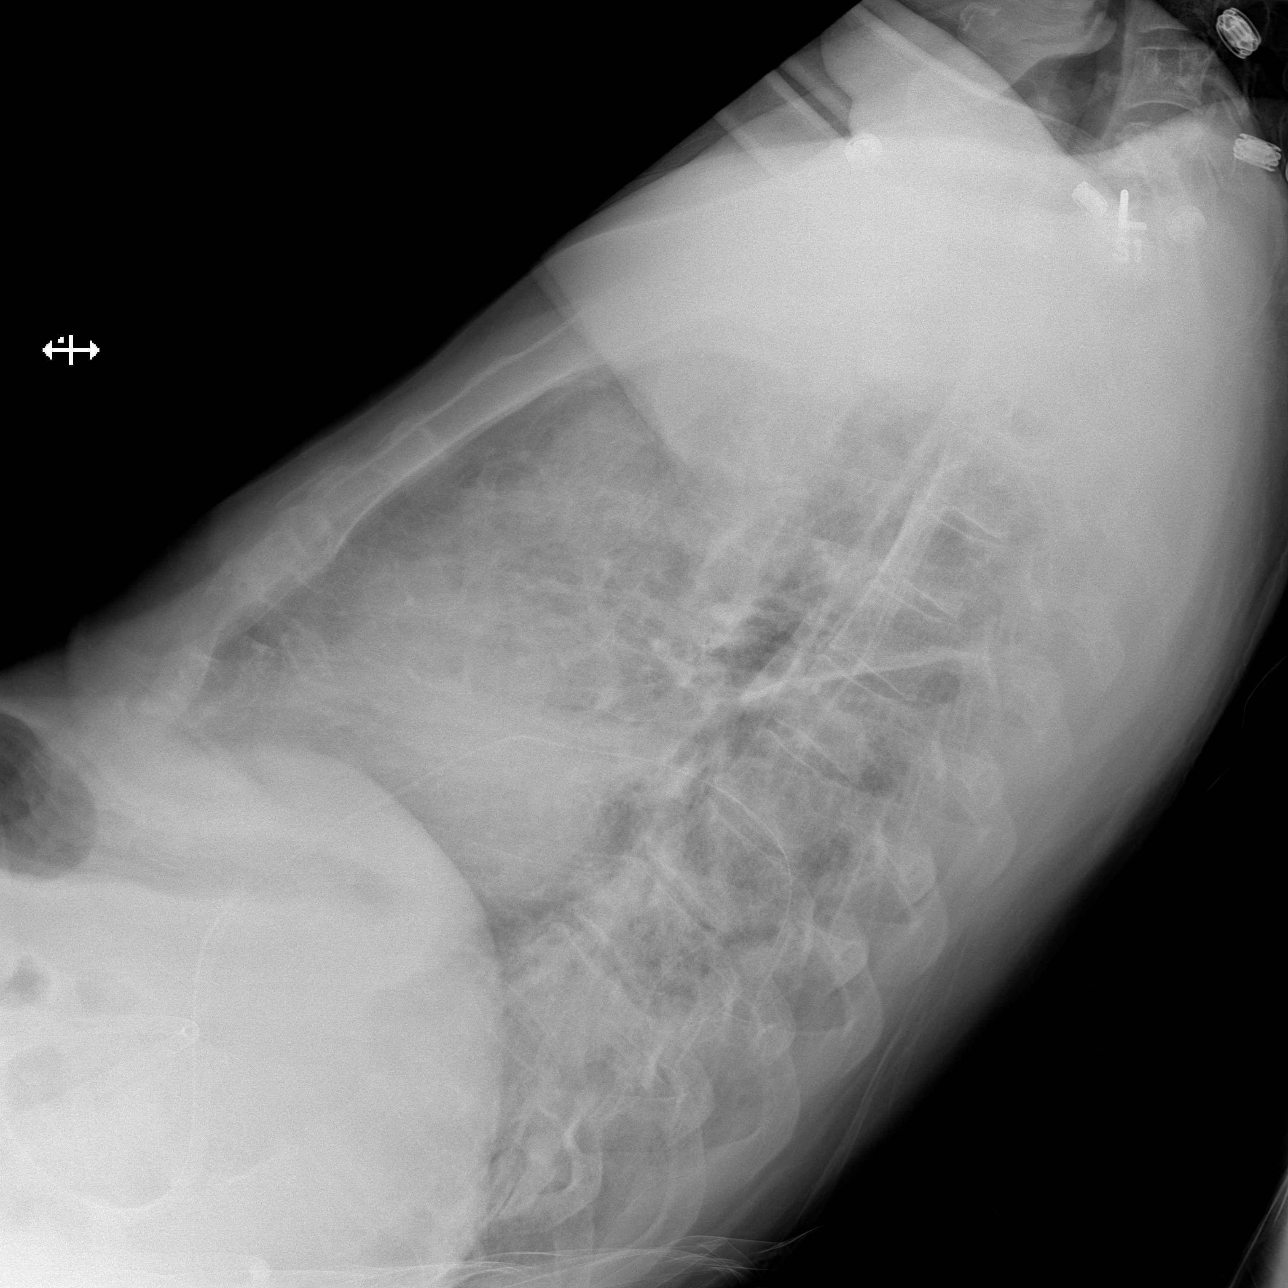

[w chest decub]
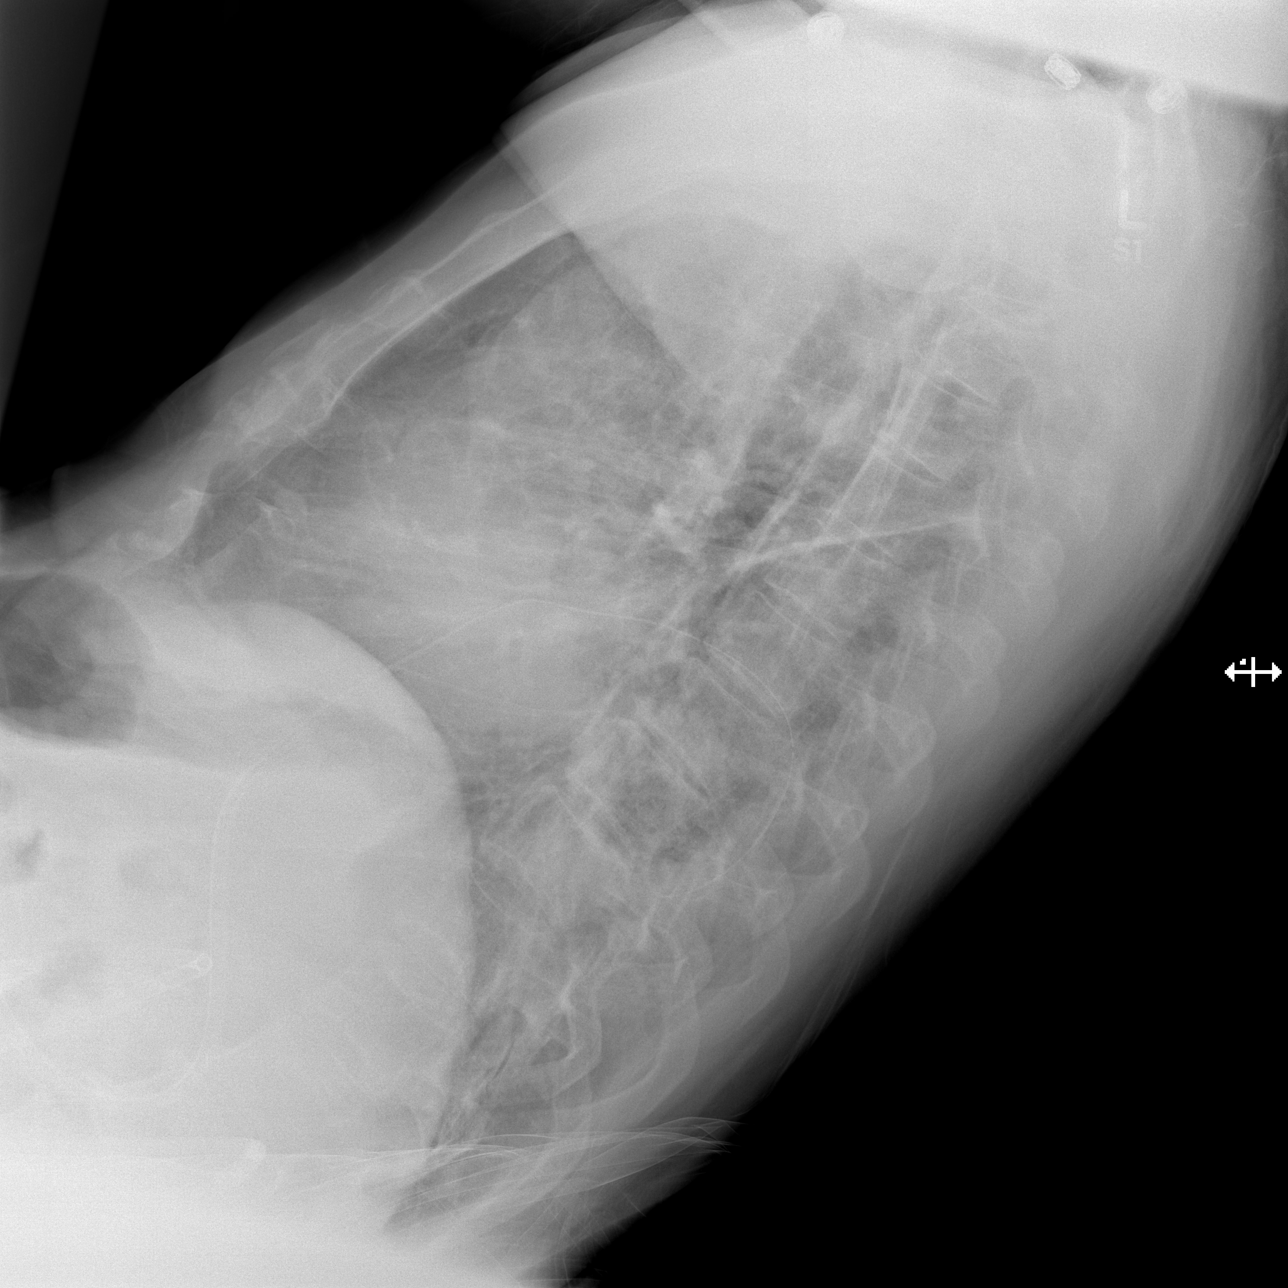

[x chest ap]
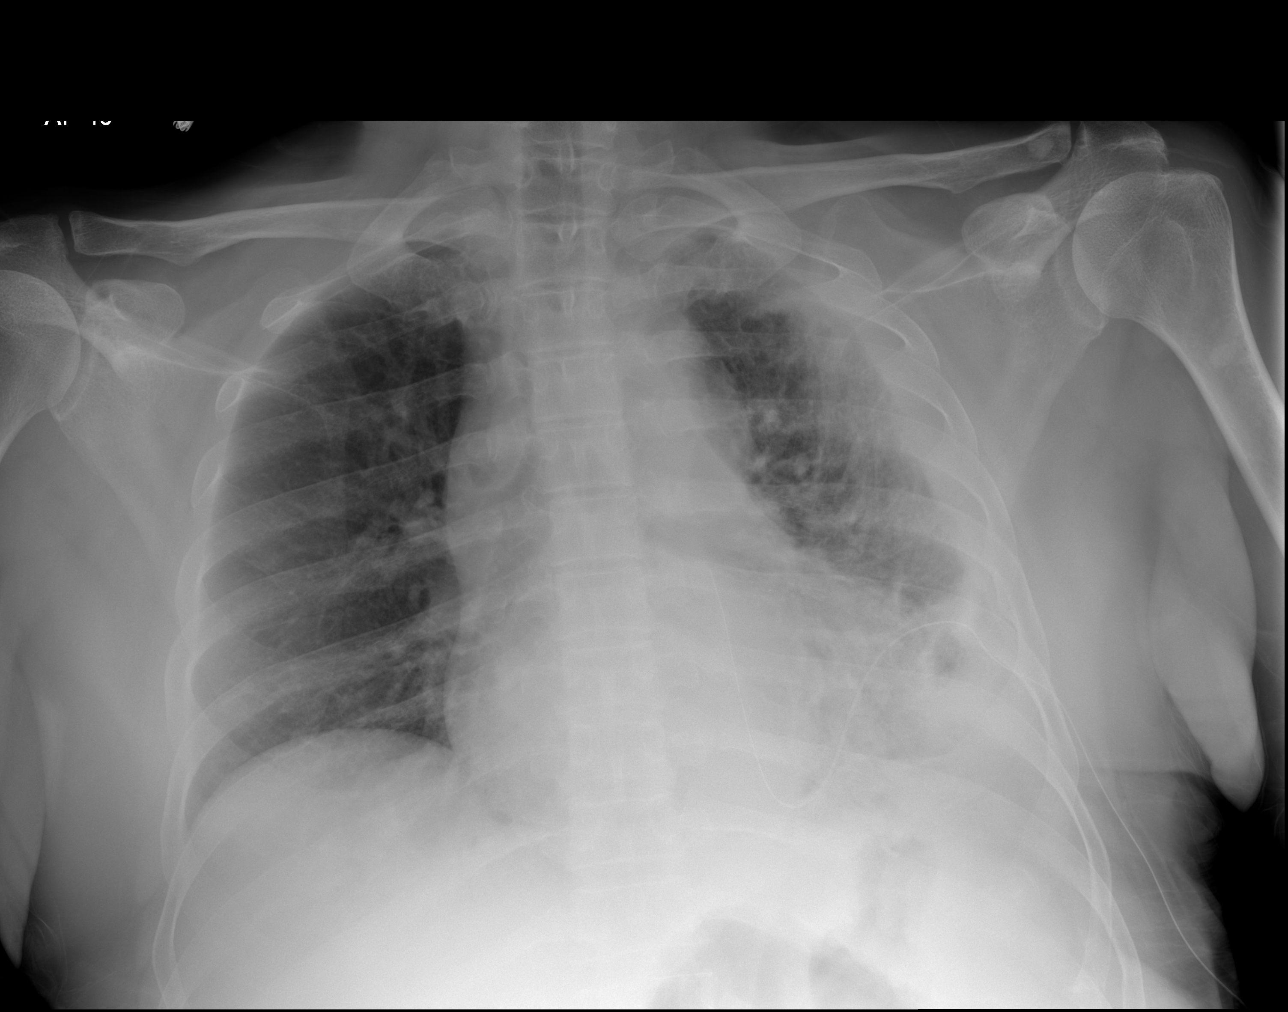

[3 of 3 positions shown; findings below may reference images not displayed]

CLINICAL DATA
Evaluate new pleural drain

EXAM
CHEST  2 VIEW

COMPARISON
IR ABSCESS DRAINAGE dated 03/14/2013; DG CHEST 1V PORT dated 03/13/2013

FINDINGS
Stable mild cardiac enlargement.  Right lung remains clear.

On left side, there is a small to moderate loculated pleural
effusions seen along the lateral and inferior aspect of the thorax.
There is a left chest tube in place, which extends along the
inferior aspect of the thorax for the medial pleural surface and
than extends cranially with its tip at the level of the carina.
There is no change in the appearance of left effusion since the
prior study. Left lower lobe consolidation is also unchanged.

IMPRESSION
Stable left pleural effusion.  No pneumothorax.

SIGNATURE
